# Patient Record
Sex: Female | Born: 1990 | Hispanic: Refuse to answer | Marital: Single | Smoking: Former smoker
Health system: Southern US, Community
[De-identification: ages and names within clinical notes are randomized; demographics above are authoritative.]

## PROBLEM LIST (undated history)

## (undated) DIAGNOSIS — Z789 Other specified health status: Secondary | ICD-10-CM

## (undated) DIAGNOSIS — F909 Attention-deficit hyperactivity disorder, unspecified type: Secondary | ICD-10-CM

## (undated) DIAGNOSIS — F419 Anxiety disorder, unspecified: Secondary | ICD-10-CM

## (undated) DIAGNOSIS — S129XXA Fracture of neck, unspecified, initial encounter: Secondary | ICD-10-CM

## (undated) DIAGNOSIS — B009 Herpesviral infection, unspecified: Secondary | ICD-10-CM

## (undated) DIAGNOSIS — A749 Chlamydial infection, unspecified: Secondary | ICD-10-CM

## (undated) DIAGNOSIS — F32A Depression, unspecified: Secondary | ICD-10-CM

## (undated) HISTORY — DX: Attention-deficit hyperactivity disorder, unspecified type: F90.9

## (undated) HISTORY — DX: Depression, unspecified: F32.A

## (undated) HISTORY — DX: Anxiety disorder, unspecified: F41.9

## (undated) HISTORY — PX: OTHER SURGICAL HISTORY: SHX169

## (undated) HISTORY — PX: ADENOIDECTOMY: SUR15

## (undated) HISTORY — PX: TONSILLECTOMY AND ADENOIDECTOMY: SUR1326

---
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
## 2015-02-26 LAB — OB RESULTS CONSOLE RUBELLA ANTIBODY, IGM: Rubella: NON-IMMUNE/NOT IMMUNE

## 2015-02-26 LAB — OB RESULTS CONSOLE GC/CHLAMYDIA
CHLAMYDIA, DNA PROBE: NEGATIVE
GC PROBE AMP, GENITAL: NEGATIVE

## 2015-02-26 LAB — OB RESULTS CONSOLE HEPATITIS B SURFACE ANTIGEN: Hepatitis B Surface Ag: NEGATIVE

## 2015-02-26 LAB — OB RESULTS CONSOLE ANTIBODY SCREEN: ANTIBODY SCREEN: NEGATIVE

## 2015-02-26 LAB — OB RESULTS CONSOLE ABO/RH: RH Type: POSITIVE

## 2015-06-03 ENCOUNTER — Other Ambulatory Visit (HOSPITAL_COMMUNITY): Payer: Self-pay | Admitting: Obstetrics and Gynecology

## 2015-06-03 DIAGNOSIS — O30002 Twin pregnancy, unspecified number of placenta and unspecified number of amniotic sacs, second trimester: Secondary | ICD-10-CM

## 2015-06-03 DIAGNOSIS — Z3689 Encounter for other specified antenatal screening: Secondary | ICD-10-CM

## 2015-06-03 DIAGNOSIS — Z3A22 22 weeks gestation of pregnancy: Secondary | ICD-10-CM

## 2015-06-10 ENCOUNTER — Encounter (HOSPITAL_COMMUNITY): Payer: Self-pay

## 2015-06-10 ENCOUNTER — Other Ambulatory Visit (HOSPITAL_COMMUNITY): Payer: Self-pay | Admitting: Obstetrics and Gynecology

## 2015-06-10 ENCOUNTER — Ambulatory Visit (HOSPITAL_COMMUNITY)
Admission: RE | Admit: 2015-06-10 | Discharge: 2015-06-10 | Disposition: A | Discharge status: Home / Self Care | Payer: BLUE CROSS/BLUE SHIELD | Source: Ambulatory Visit | Attending: Obstetrics and Gynecology | Admitting: Obstetrics and Gynecology

## 2015-06-10 DIAGNOSIS — O99332 Smoking (tobacco) complicating pregnancy, second trimester: Secondary | ICD-10-CM

## 2015-06-10 DIAGNOSIS — O30042 Twin pregnancy, dichorionic/diamniotic, second trimester: Secondary | ICD-10-CM | POA: Insufficient documentation

## 2015-06-10 DIAGNOSIS — Z3A22 22 weeks gestation of pregnancy: Secondary | ICD-10-CM | POA: Diagnosis not present

## 2015-06-10 DIAGNOSIS — O365922 Maternal care for other known or suspected poor fetal growth, second trimester, fetus 2: Secondary | ICD-10-CM | POA: Diagnosis not present

## 2015-06-10 DIAGNOSIS — Z3689 Encounter for other specified antenatal screening: Secondary | ICD-10-CM

## 2015-06-10 DIAGNOSIS — O30002 Twin pregnancy, unspecified number of placenta and unspecified number of amniotic sacs, second trimester: Secondary | ICD-10-CM

## 2015-06-10 DIAGNOSIS — Z36 Encounter for antenatal screening of mother: Secondary | ICD-10-CM | POA: Diagnosis not present

## 2015-06-19 ENCOUNTER — Other Ambulatory Visit (HOSPITAL_COMMUNITY): Payer: Self-pay | Admitting: Obstetrics and Gynecology

## 2015-06-19 ENCOUNTER — Ambulatory Visit (HOSPITAL_COMMUNITY)
Admission: RE | Admit: 2015-06-19 | Discharge: 2015-06-19 | Disposition: A | Discharge status: Home / Self Care | Payer: BLUE CROSS/BLUE SHIELD | Source: Ambulatory Visit | Attending: Obstetrics and Gynecology | Admitting: Obstetrics and Gynecology

## 2015-06-19 ENCOUNTER — Encounter (HOSPITAL_COMMUNITY): Payer: Self-pay

## 2015-06-19 DIAGNOSIS — O99332 Smoking (tobacco) complicating pregnancy, second trimester: Secondary | ICD-10-CM

## 2015-06-19 DIAGNOSIS — O30042 Twin pregnancy, dichorionic/diamniotic, second trimester: Secondary | ICD-10-CM

## 2015-06-19 DIAGNOSIS — Z3A23 23 weeks gestation of pregnancy: Secondary | ICD-10-CM

## 2015-06-19 DIAGNOSIS — O365922 Maternal care for other known or suspected poor fetal growth, second trimester, fetus 2: Secondary | ICD-10-CM | POA: Diagnosis not present

## 2015-06-19 HISTORY — DX: Other specified health status: Z78.9

## 2015-06-25 ENCOUNTER — Encounter (HOSPITAL_COMMUNITY): Payer: Self-pay

## 2015-06-25 ENCOUNTER — Ambulatory Visit (HOSPITAL_COMMUNITY)
Admission: RE | Admit: 2015-06-25 | Discharge: 2015-06-25 | Disposition: A | Discharge status: Home / Self Care | Payer: BLUE CROSS/BLUE SHIELD | Source: Ambulatory Visit | Attending: Obstetrics and Gynecology | Admitting: Obstetrics and Gynecology

## 2015-06-25 DIAGNOSIS — O365922 Maternal care for other known or suspected poor fetal growth, second trimester, fetus 2: Secondary | ICD-10-CM | POA: Diagnosis not present

## 2015-06-25 DIAGNOSIS — O30042 Twin pregnancy, dichorionic/diamniotic, second trimester: Secondary | ICD-10-CM | POA: Diagnosis present

## 2015-07-02 ENCOUNTER — Ambulatory Visit (HOSPITAL_COMMUNITY)
Admission: RE | Admit: 2015-07-02 | Discharge: 2015-07-02 | Disposition: A | Discharge status: Home / Self Care | Payer: BLUE CROSS/BLUE SHIELD | Source: Ambulatory Visit | Attending: Obstetrics and Gynecology | Admitting: Obstetrics and Gynecology

## 2015-07-02 ENCOUNTER — Encounter (HOSPITAL_COMMUNITY): Payer: Self-pay

## 2015-07-02 DIAGNOSIS — O30042 Twin pregnancy, dichorionic/diamniotic, second trimester: Secondary | ICD-10-CM | POA: Diagnosis not present

## 2015-07-02 DIAGNOSIS — O365922 Maternal care for other known or suspected poor fetal growth, second trimester, fetus 2: Secondary | ICD-10-CM | POA: Diagnosis not present

## 2015-07-08 ENCOUNTER — Other Ambulatory Visit (HOSPITAL_COMMUNITY): Payer: Self-pay | Admitting: Obstetrics and Gynecology

## 2015-07-08 DIAGNOSIS — O99333 Smoking (tobacco) complicating pregnancy, third trimester: Secondary | ICD-10-CM

## 2015-07-08 DIAGNOSIS — O365922 Maternal care for other known or suspected poor fetal growth, second trimester, fetus 2: Secondary | ICD-10-CM

## 2015-07-08 DIAGNOSIS — O30042 Twin pregnancy, dichorionic/diamniotic, second trimester: Secondary | ICD-10-CM

## 2015-07-08 DIAGNOSIS — Z3A26 26 weeks gestation of pregnancy: Secondary | ICD-10-CM

## 2015-07-09 ENCOUNTER — Ambulatory Visit (HOSPITAL_COMMUNITY)
Admission: RE | Admit: 2015-07-09 | Discharge: 2015-07-09 | Disposition: A | Discharge status: Home / Self Care | Payer: BLUE CROSS/BLUE SHIELD | Source: Ambulatory Visit | Attending: Obstetrics and Gynecology | Admitting: Obstetrics and Gynecology

## 2015-07-09 ENCOUNTER — Encounter (HOSPITAL_COMMUNITY): Payer: Self-pay

## 2015-07-09 DIAGNOSIS — O99333 Smoking (tobacco) complicating pregnancy, third trimester: Secondary | ICD-10-CM

## 2015-07-09 DIAGNOSIS — Z3A26 26 weeks gestation of pregnancy: Secondary | ICD-10-CM | POA: Diagnosis not present

## 2015-07-09 DIAGNOSIS — O99332 Smoking (tobacco) complicating pregnancy, second trimester: Secondary | ICD-10-CM | POA: Diagnosis not present

## 2015-07-09 DIAGNOSIS — O365922 Maternal care for other known or suspected poor fetal growth, second trimester, fetus 2: Secondary | ICD-10-CM | POA: Insufficient documentation

## 2015-07-09 DIAGNOSIS — O30042 Twin pregnancy, dichorionic/diamniotic, second trimester: Secondary | ICD-10-CM | POA: Insufficient documentation

## 2015-07-09 LAB — OB RESULTS CONSOLE RPR: RPR: NONREACTIVE

## 2015-07-09 LAB — OB RESULTS CONSOLE HIV ANTIBODY (ROUTINE TESTING): HIV: NONREACTIVE

## 2015-08-13 ENCOUNTER — Inpatient Hospital Stay (HOSPITAL_COMMUNITY)
Admission: AD | Admit: 2015-08-13 | Discharge: 2015-08-13 | Disposition: A | Discharge status: Home / Self Care | Payer: BLUE CROSS/BLUE SHIELD | Source: Ambulatory Visit | Attending: Obstetrics and Gynecology | Admitting: Obstetrics and Gynecology

## 2015-08-13 DIAGNOSIS — Z3A31 31 weeks gestation of pregnancy: Secondary | ICD-10-CM | POA: Insufficient documentation

## 2015-08-13 MED ORDER — BETAMETHASONE SOD PHOS & ACET 6 (3-3) MG/ML IJ SUSP
12.0000 mg | Freq: Once | INTRAMUSCULAR | Status: AC
  Administered 2015-08-13: 12 mg via INTRAMUSCULAR
  Filled 2015-08-13: qty 2

## 2015-08-13 NOTE — MAU Note (Signed)
Sent from office for betamethasone injection

## 2015-08-14 ENCOUNTER — Inpatient Hospital Stay (HOSPITAL_COMMUNITY)
Admission: AD | Admit: 2015-08-14 | Discharge: 2015-08-14 | Disposition: A | Discharge status: Home / Self Care | Payer: BLUE CROSS/BLUE SHIELD | Source: Ambulatory Visit | Attending: Obstetrics & Gynecology | Admitting: Obstetrics & Gynecology

## 2015-08-14 DIAGNOSIS — Z3A31 31 weeks gestation of pregnancy: Secondary | ICD-10-CM | POA: Diagnosis not present

## 2015-08-14 MED ORDER — BETAMETHASONE SOD PHOS & ACET 6 (3-3) MG/ML IJ SUSP
12.0000 mg | Freq: Once | INTRAMUSCULAR | Status: AC
  Administered 2015-08-14: 12 mg via INTRAMUSCULAR
  Filled 2015-08-14: qty 2

## 2015-08-18 ENCOUNTER — Other Ambulatory Visit (HOSPITAL_COMMUNITY): Payer: Self-pay | Admitting: Obstetrics & Gynecology

## 2015-08-18 ENCOUNTER — Ambulatory Visit (HOSPITAL_COMMUNITY)
Admission: RE | Admit: 2015-08-18 | Discharge: 2015-08-18 | Disposition: A | Discharge status: Home / Self Care | Payer: BLUE CROSS/BLUE SHIELD | Source: Ambulatory Visit | Attending: Obstetrics & Gynecology | Admitting: Obstetrics & Gynecology

## 2015-08-18 DIAGNOSIS — O365922 Maternal care for other known or suspected poor fetal growth, second trimester, fetus 2: Secondary | ICD-10-CM | POA: Insufficient documentation

## 2015-08-18 DIAGNOSIS — O30042 Twin pregnancy, dichorionic/diamniotic, second trimester: Secondary | ICD-10-CM | POA: Insufficient documentation

## 2015-08-18 DIAGNOSIS — IMO0002 Reserved for concepts with insufficient information to code with codable children: Secondary | ICD-10-CM

## 2015-08-18 DIAGNOSIS — IMO0001 Reserved for inherently not codable concepts without codable children: Secondary | ICD-10-CM

## 2015-08-18 DIAGNOSIS — O99332 Smoking (tobacco) complicating pregnancy, second trimester: Secondary | ICD-10-CM | POA: Insufficient documentation

## 2015-08-18 DIAGNOSIS — Z3A32 32 weeks gestation of pregnancy: Secondary | ICD-10-CM

## 2015-08-25 ENCOUNTER — Ambulatory Visit (HOSPITAL_COMMUNITY)
Admission: RE | Admit: 2015-08-25 | Discharge: 2015-08-25 | Disposition: A | Discharge status: Home / Self Care | Payer: BLUE CROSS/BLUE SHIELD | Source: Ambulatory Visit | Attending: Obstetrics & Gynecology | Admitting: Obstetrics & Gynecology

## 2015-08-25 DIAGNOSIS — IMO0001 Reserved for inherently not codable concepts without codable children: Secondary | ICD-10-CM

## 2015-08-25 DIAGNOSIS — O365931 Maternal care for other known or suspected poor fetal growth, third trimester, fetus 1: Secondary | ICD-10-CM | POA: Diagnosis not present

## 2015-08-25 DIAGNOSIS — O365932 Maternal care for other known or suspected poor fetal growth, third trimester, fetus 2: Secondary | ICD-10-CM | POA: Diagnosis not present

## 2015-08-25 DIAGNOSIS — O99332 Smoking (tobacco) complicating pregnancy, second trimester: Secondary | ICD-10-CM | POA: Diagnosis not present

## 2015-08-25 DIAGNOSIS — O30042 Twin pregnancy, dichorionic/diamniotic, second trimester: Secondary | ICD-10-CM | POA: Diagnosis not present

## 2015-08-25 DIAGNOSIS — Z3A33 33 weeks gestation of pregnancy: Secondary | ICD-10-CM | POA: Insufficient documentation

## 2015-09-01 ENCOUNTER — Other Ambulatory Visit: Payer: Self-pay | Admitting: Obstetrics & Gynecology

## 2015-09-01 ENCOUNTER — Encounter (HOSPITAL_COMMUNITY): Payer: Self-pay | Admitting: Anesthesiology

## 2015-09-01 ENCOUNTER — Ambulatory Visit (HOSPITAL_COMMUNITY)
Admission: RE | Admit: 2015-09-01 | Discharge: 2015-09-01 | Disposition: A | Discharge status: Home / Self Care | Payer: BLUE CROSS/BLUE SHIELD | Source: Ambulatory Visit | Attending: Maternal and Fetal Medicine | Admitting: Maternal and Fetal Medicine

## 2015-09-01 ENCOUNTER — Encounter (HOSPITAL_COMMUNITY): Payer: Self-pay

## 2015-09-01 DIAGNOSIS — O30042 Twin pregnancy, dichorionic/diamniotic, second trimester: Secondary | ICD-10-CM | POA: Diagnosis present

## 2015-09-01 DIAGNOSIS — O365932 Maternal care for other known or suspected poor fetal growth, third trimester, fetus 2: Secondary | ICD-10-CM | POA: Diagnosis not present

## 2015-09-01 DIAGNOSIS — O365931 Maternal care for other known or suspected poor fetal growth, third trimester, fetus 1: Secondary | ICD-10-CM | POA: Diagnosis not present

## 2015-09-01 DIAGNOSIS — O99332 Smoking (tobacco) complicating pregnancy, second trimester: Secondary | ICD-10-CM | POA: Diagnosis not present

## 2015-09-01 DIAGNOSIS — Z3A34 34 weeks gestation of pregnancy: Secondary | ICD-10-CM | POA: Insufficient documentation

## 2015-09-01 DIAGNOSIS — IMO0001 Reserved for inherently not codable concepts without codable children: Secondary | ICD-10-CM

## 2015-09-08 ENCOUNTER — Ambulatory Visit (HOSPITAL_COMMUNITY)
Admission: RE | Admit: 2015-09-08 | Discharge: 2015-09-08 | Disposition: A | Discharge status: Home / Self Care | Payer: BLUE CROSS/BLUE SHIELD | Source: Ambulatory Visit | Attending: Obstetrics & Gynecology | Admitting: Obstetrics & Gynecology

## 2015-09-08 ENCOUNTER — Encounter (HOSPITAL_COMMUNITY): Payer: Self-pay

## 2015-09-08 DIAGNOSIS — Z3A35 35 weeks gestation of pregnancy: Secondary | ICD-10-CM

## 2015-09-08 DIAGNOSIS — O365931 Maternal care for other known or suspected poor fetal growth, third trimester, fetus 1: Secondary | ICD-10-CM

## 2015-09-08 DIAGNOSIS — O99333 Smoking (tobacco) complicating pregnancy, third trimester: Secondary | ICD-10-CM

## 2015-09-08 DIAGNOSIS — O365932 Maternal care for other known or suspected poor fetal growth, third trimester, fetus 2: Secondary | ICD-10-CM

## 2015-09-08 DIAGNOSIS — O30043 Twin pregnancy, dichorionic/diamniotic, third trimester: Secondary | ICD-10-CM

## 2015-09-08 DIAGNOSIS — IMO0001 Reserved for inherently not codable concepts without codable children: Secondary | ICD-10-CM

## 2015-09-10 ENCOUNTER — Encounter (HOSPITAL_COMMUNITY): Payer: Self-pay

## 2015-09-10 NOTE — Patient Instructions (Signed)
Your procedure is scheduled on:  Monday, Dec. 5, 2016  [facility] at:  1:45 P.M.  Pick up the phone at the desk and dial [phone].  Call this number if you have problems the morning of surgery: [phone].  Remember: Do NOT eat food:  After Midnight Sunday Do NOT drink clear liquids after:  11:00 A.M. Day of surgery Take these medicines the morning of surgery with a SIP OF WATER:  None  Do NOT wear jewelry (body piercing), metal hair clips/bobby pins, or nail polish. Do NOT wear lotions, powders, or perfumes.  You may wear deoderant. Do NOT shave for 48 hours prior to surgery. Do NOT bring valuables to the hospital. Leave suitcase in car.  After surgery it may be brought to your room.  For patients admitted to the hospital, checkout time is 11:00 AM the day of discharge.

## 2015-09-10 NOTE — Anesthesia Preprocedure Evaluation (Deleted)
Anesthesia Evaluation  Patient identified by MRN, date of birth, ID band Patient awake    Reviewed: Allergy & Precautions, NPO status , Patient's Chart, lab work & pertinent test results  Airway        Dental   Pulmonary Current Smoker,           Cardiovascular   BP slightly elevated   Neuro/Psych negative neurological ROS     GI/Hepatic negative GI ROS, Neg liver ROS,   Endo/Other    Renal/GU negative Renal ROS     Musculoskeletal   Abdominal   Peds  Hematology   Anesthesia Other Findings   Reproductive/Obstetrics (+) Pregnancy TWINS                             Anesthesia Physical Anesthesia Plan  ASA: II  Anesthesia Plan: Spinal   Post-op Pain Management:    Induction:   Airway Management Planned:   Additional Equipment:   Intra-op Plan:   Post-operative Plan:   Informed Consent: I have reviewed the patients History and Physical, chart, labs and discussed the procedure including the risks, benefits and alternatives for the proposed anesthesia with the patient or authorized representative who has indicated his/her understanding and acceptance.     Plan Discussed with:   Anesthesia Plan Comments: (Check am labs)        Anesthesia Quick Evaluation

## 2015-09-11 ENCOUNTER — Encounter (HOSPITAL_COMMUNITY)
Admission: RE | Admit: 2015-09-11 | Discharge: 2015-09-11 | Disposition: A | Discharge status: Home / Self Care | Payer: BLUE CROSS/BLUE SHIELD | Source: Ambulatory Visit | Attending: Obstetrics & Gynecology | Admitting: Obstetrics & Gynecology

## 2015-09-11 ENCOUNTER — Encounter (HOSPITAL_COMMUNITY): Payer: Self-pay | Admitting: *Deleted

## 2015-09-11 ENCOUNTER — Encounter (HOSPITAL_COMMUNITY)
Admission: AD | Disposition: A | Discharge status: Home / Self Care | Payer: Self-pay | Source: Ambulatory Visit | Attending: Obstetrics and Gynecology

## 2015-09-11 ENCOUNTER — Encounter (HOSPITAL_COMMUNITY): Payer: Self-pay

## 2015-09-11 ENCOUNTER — Inpatient Hospital Stay (HOSPITAL_COMMUNITY): Payer: BLUE CROSS/BLUE SHIELD | Admitting: Anesthesiology

## 2015-09-11 ENCOUNTER — Inpatient Hospital Stay (HOSPITAL_COMMUNITY)
Admission: AD | Admit: 2015-09-11 | Discharge: 2015-09-14 | DRG: 765 | Disposition: A | Discharge status: Home / Self Care | Payer: BLUE CROSS/BLUE SHIELD | Source: Ambulatory Visit | Attending: Obstetrics and Gynecology | Admitting: Obstetrics and Gynecology

## 2015-09-11 DIAGNOSIS — O42913 Preterm premature rupture of membranes, unspecified as to length of time between rupture and onset of labor, third trimester: Secondary | ICD-10-CM | POA: Diagnosis present

## 2015-09-11 DIAGNOSIS — N838 Other noninflammatory disorders of ovary, fallopian tube and broad ligament: Secondary | ICD-10-CM | POA: Diagnosis present

## 2015-09-11 DIAGNOSIS — Z283 Underimmunization status: Secondary | ICD-10-CM

## 2015-09-11 DIAGNOSIS — Z98891 History of uterine scar from previous surgery: Secondary | ICD-10-CM

## 2015-09-11 DIAGNOSIS — O321XX0 Maternal care for breech presentation, not applicable or unspecified: Principal | ICD-10-CM | POA: Diagnosis present

## 2015-09-11 DIAGNOSIS — O98319 Other infections with a predominantly sexual mode of transmission complicating pregnancy, unspecified trimester: Secondary | ICD-10-CM | POA: Diagnosis present

## 2015-09-11 DIAGNOSIS — Z3A35 35 weeks gestation of pregnancy: Secondary | ICD-10-CM

## 2015-09-11 DIAGNOSIS — S129XXA Fracture of neck, unspecified, initial encounter: Secondary | ICD-10-CM | POA: Diagnosis not present

## 2015-09-11 DIAGNOSIS — B009 Herpesviral infection, unspecified: Secondary | ICD-10-CM | POA: Diagnosis not present

## 2015-09-11 DIAGNOSIS — O99334 Smoking (tobacco) complicating childbirth: Secondary | ICD-10-CM | POA: Diagnosis present

## 2015-09-11 DIAGNOSIS — O30009 Twin pregnancy, unspecified number of placenta and unspecified number of amniotic sacs, unspecified trimester: Secondary | ICD-10-CM | POA: Diagnosis present

## 2015-09-11 DIAGNOSIS — Z885 Allergy status to narcotic agent status: Secondary | ICD-10-CM

## 2015-09-11 DIAGNOSIS — IMO0001 Reserved for inherently not codable concepts without codable children: Secondary | ICD-10-CM

## 2015-09-11 DIAGNOSIS — O321XX1 Maternal care for breech presentation, fetus 1: Secondary | ICD-10-CM | POA: Diagnosis present

## 2015-09-11 DIAGNOSIS — O30003 Twin pregnancy, unspecified number of placenta and unspecified number of amniotic sacs, third trimester: Secondary | ICD-10-CM | POA: Diagnosis present

## 2015-09-11 DIAGNOSIS — Z2839 Other underimmunization status: Secondary | ICD-10-CM

## 2015-09-11 DIAGNOSIS — F17200 Nicotine dependence, unspecified, uncomplicated: Secondary | ICD-10-CM

## 2015-09-11 DIAGNOSIS — Z91018 Allergy to other foods: Secondary | ICD-10-CM | POA: Diagnosis not present

## 2015-09-11 DIAGNOSIS — O09899 Supervision of other high risk pregnancies, unspecified trimester: Secondary | ICD-10-CM

## 2015-09-11 DIAGNOSIS — O9989 Other specified diseases and conditions complicating pregnancy, childbirth and the puerperium: Secondary | ICD-10-CM

## 2015-09-11 DIAGNOSIS — A6000 Herpesviral infection of urogenital system, unspecified: Secondary | ICD-10-CM | POA: Diagnosis present

## 2015-09-11 DIAGNOSIS — O30043 Twin pregnancy, dichorionic/diamniotic, third trimester: Secondary | ICD-10-CM | POA: Diagnosis present

## 2015-09-11 DIAGNOSIS — O09299 Supervision of pregnancy with other poor reproductive or obstetric history, unspecified trimester: Secondary | ICD-10-CM

## 2015-09-11 DIAGNOSIS — O365930 Maternal care for other known or suspected poor fetal growth, third trimester, not applicable or unspecified: Secondary | ICD-10-CM | POA: Diagnosis present

## 2015-09-11 HISTORY — DX: Herpesviral infection, unspecified: B00.9

## 2015-09-11 HISTORY — DX: Fracture of neck, unspecified, initial encounter: S12.9XXA

## 2015-09-11 HISTORY — DX: Chlamydial infection, unspecified: A74.9

## 2015-09-11 LAB — CBC
HEMATOCRIT: 40.1 % (ref 36.0–46.0)
Hemoglobin: 13.8 g/dL (ref 12.0–15.0)
MCH: 28.7 pg (ref 26.0–34.0)
MCHC: 34.4 g/dL (ref 30.0–36.0)
MCV: 83.4 fL (ref 78.0–100.0)
PLATELETS: 279 10*3/uL (ref 150–400)
RBC: 4.81 MIL/uL (ref 3.87–5.11)
RDW: 13.8 % (ref 11.5–15.5)
WBC: 11.9 10*3/uL — ABNORMAL HIGH (ref 4.0–10.5)

## 2015-09-11 LAB — COMPREHENSIVE METABOLIC PANEL
ALK PHOS: 117 U/L (ref 38–126)
ALT: 14 U/L (ref 14–54)
AST: 17 U/L (ref 15–41)
Albumin: 2.9 g/dL — ABNORMAL LOW (ref 3.5–5.0)
Anion gap: 10 (ref 5–15)
BILIRUBIN TOTAL: 0.2 mg/dL — AB (ref 0.3–1.2)
BUN: 6 mg/dL (ref 6–20)
CALCIUM: 8.8 mg/dL — AB (ref 8.9–10.3)
CHLORIDE: 100 mmol/L — AB (ref 101–111)
CO2: 19 mmol/L — ABNORMAL LOW (ref 22–32)
CREATININE: 0.44 mg/dL (ref 0.44–1.00)
GFR calc Af Amer: >60 mL/min (ref >60)
Glucose, Bld: 84 mg/dL (ref 65–99)
Potassium: 3.7 mmol/L (ref 3.5–5.1)
Sodium: 129 mmol/L — ABNORMAL LOW (ref 135–145)
TOTAL PROTEIN: 6 g/dL — AB (ref 6.5–8.1)

## 2015-09-11 LAB — GROUP B STREP BY PCR: GROUP B STREP BY PCR: NEGATIVE

## 2015-09-11 LAB — TYPE AND SCREEN
ABO/RH(D): O POS
Antibody Screen: NEGATIVE

## 2015-09-11 LAB — PROTEIN / CREATININE RATIO, URINE
Creatinine, Urine: 165 mg/dL
Protein Creatinine Ratio: 0.11 mg/mg{Cre} (ref 0.00–0.15)
Total Protein, Urine: 18 mg/dL

## 2015-09-11 LAB — ABO/RH: ABO/RH(D): O POS

## 2015-09-11 LAB — LACTATE DEHYDROGENASE: LDH: 130 U/L (ref 98–192)

## 2015-09-11 LAB — URIC ACID: URIC ACID, SERUM: 5.9 mg/dL (ref 2.3–6.6)

## 2015-09-11 SURGERY — Surgical Case
Anesthesia: Spinal

## 2015-09-11 MED ORDER — NALOXONE HCL 2 MG/2ML IJ SOSY
1.0000 ug/kg/h | PREFILLED_SYRINGE | INTRAVENOUS | Status: DC | PRN
  Filled 2015-09-11: qty 2

## 2015-09-11 MED ORDER — OXYTOCIN 10 UNIT/ML IJ SOLN
40.0000 [IU] | INTRAVENOUS | Status: DC | PRN
  Administered 2015-09-11: 40 [IU] via INTRAVENOUS

## 2015-09-11 MED ORDER — KETOROLAC TROMETHAMINE 30 MG/ML IJ SOLN
INTRAMUSCULAR | Status: AC
  Filled 2015-09-11: qty 1

## 2015-09-11 MED ORDER — PHENYLEPHRINE 40 MCG/ML (10ML) SYRINGE FOR IV PUSH (FOR BLOOD PRESSURE SUPPORT)
PREFILLED_SYRINGE | INTRAVENOUS | Status: AC
  Filled 2015-09-11: qty 10

## 2015-09-11 MED ORDER — DIPHENHYDRAMINE HCL 25 MG PO CAPS: 25.0000 mg | ORAL_CAPSULE | ORAL | Status: DC | PRN

## 2015-09-11 MED ORDER — BETAMETHASONE SOD PHOS & ACET 6 (3-3) MG/ML IJ SUSP
12.0000 mg | INTRAMUSCULAR | Status: DC
  Filled 2015-09-11: qty 2

## 2015-09-11 MED ORDER — BUPIVACAINE IN DEXTROSE 0.75-8.25 % IT SOLN
INTRATHECAL | Status: DC | PRN
  Administered 2015-09-11: 1.4 mL via INTRATHECAL

## 2015-09-11 MED ORDER — HYDRALAZINE HCL 20 MG/ML IJ SOLN: 10.0000 mg | Freq: Once | INTRAMUSCULAR | Status: DC | PRN

## 2015-09-11 MED ORDER — DIPHENHYDRAMINE HCL 50 MG/ML IJ SOLN: 12.5000 mg | INTRAMUSCULAR | Status: DC | PRN

## 2015-09-11 MED ORDER — SODIUM CHLORIDE 0.9 % IJ SOLN: 3.0000 mL | INTRAMUSCULAR | Status: DC | PRN

## 2015-09-11 MED ORDER — FENTANYL CITRATE (PF) 100 MCG/2ML IJ SOLN
INTRAMUSCULAR | Status: DC | PRN
  Administered 2015-09-11: 25 ug via INTRATHECAL

## 2015-09-11 MED ORDER — CEFAZOLIN SODIUM-DEXTROSE 2-3 GM-% IV SOLR: 2.0000 g | Freq: Once | INTRAVENOUS | Status: DC

## 2015-09-11 MED ORDER — NALOXONE HCL 0.4 MG/ML IJ SOLN: 0.4000 mg | INTRAMUSCULAR | Status: DC | PRN

## 2015-09-11 MED ORDER — LABETALOL HCL 5 MG/ML IV SOLN
20.0000 mg | INTRAVENOUS | Status: DC | PRN
Start: 2015-09-11 — End: 2015-09-14

## 2015-09-11 MED ORDER — KETOROLAC TROMETHAMINE 30 MG/ML IJ SOLN: 30.0000 mg | Freq: Four times a day (QID) | INTRAMUSCULAR | Status: AC | PRN

## 2015-09-11 MED ORDER — IBUPROFEN 600 MG PO TABS
600.0000 mg | ORAL_TABLET | Freq: Four times a day (QID) | ORAL | Status: DC | PRN
  Administered 2015-09-12 – 2015-09-14 (×10): 600 mg via ORAL
  Filled 2015-09-11 (×11): qty 1

## 2015-09-11 MED ORDER — MEPERIDINE HCL 25 MG/ML IJ SOLN: 6.2500 mg | INTRAMUSCULAR | Status: DC | PRN

## 2015-09-11 MED ORDER — FENTANYL CITRATE (PF) 100 MCG/2ML IJ SOLN: 25.0000 ug | INTRAMUSCULAR | Status: DC | PRN

## 2015-09-11 MED ORDER — NALBUPHINE HCL 10 MG/ML IJ SOLN: 5.0000 mg | Freq: Once | INTRAMUSCULAR | Status: DC | PRN

## 2015-09-11 MED ORDER — ONDANSETRON HCL 4 MG/2ML IJ SOLN: 4.0000 mg | Freq: Three times a day (TID) | INTRAMUSCULAR | Status: DC | PRN

## 2015-09-11 MED ORDER — CITRIC ACID-SODIUM CITRATE 334-500 MG/5ML PO SOLN
30.0000 mL | Freq: Once | ORAL | Status: AC
  Administered 2015-09-11: 30 mL via ORAL
  Filled 2015-09-11: qty 15

## 2015-09-11 MED ORDER — NALBUPHINE HCL 10 MG/ML IJ SOLN: 5.0000 mg | INTRAMUSCULAR | Status: DC | PRN

## 2015-09-11 MED ORDER — KETOROLAC TROMETHAMINE 30 MG/ML IJ SOLN
30.0000 mg | Freq: Four times a day (QID) | INTRAMUSCULAR | Status: AC | PRN
  Administered 2015-09-11: 30 mg via INTRAMUSCULAR

## 2015-09-11 MED ORDER — MORPHINE SULFATE (PF) 0.5 MG/ML IJ SOLN
INTRAMUSCULAR | Status: DC | PRN
  Administered 2015-09-11: .15 mg via EPIDURAL

## 2015-09-11 MED ORDER — CEFAZOLIN SODIUM-DEXTROSE 2-3 GM-% IV SOLR
2.0000 g | Freq: Once | INTRAVENOUS | Status: AC
  Administered 2015-09-11: 2 g via INTRAVENOUS
  Filled 2015-09-11: qty 50

## 2015-09-11 MED ORDER — FAMOTIDINE IN NACL 20-0.9 MG/50ML-% IV SOLN
20.0000 mg | Freq: Once | INTRAVENOUS | Status: AC
  Administered 2015-09-11: 20 mg via INTRAVENOUS
  Filled 2015-09-11: qty 50

## 2015-09-11 MED ORDER — LACTATED RINGERS IV SOLN
INTRAVENOUS | Status: DC
  Administered 2015-09-11 – 2015-09-12 (×5): via INTRAVENOUS

## 2015-09-11 MED ORDER — SCOPOLAMINE 1 MG/3DAYS TD PT72
1.0000 | MEDICATED_PATCH | Freq: Once | TRANSDERMAL | Status: DC
  Filled 2015-09-11: qty 1

## 2015-09-11 MED ORDER — ONDANSETRON HCL 4 MG/2ML IJ SOLN
INTRAMUSCULAR | Status: DC | PRN
  Administered 2015-09-11: 4 mg via INTRAVENOUS

## 2015-09-11 MED ORDER — ONDANSETRON HCL 4 MG/2ML IJ SOLN
INTRAMUSCULAR | Status: AC
  Filled 2015-09-11: qty 2

## 2015-09-11 MED ORDER — PHENYLEPHRINE 8 MG IN D5W 100 ML (0.08MG/ML) PREMIX OPTIME
INJECTION | INTRAVENOUS | Status: DC | PRN
  Administered 2015-09-11: 40 ug/min via INTRAVENOUS
  Administered 2015-09-11: 80 ug/min via INTRAVENOUS

## 2015-09-11 MED ORDER — SCOPOLAMINE 1 MG/3DAYS TD PT72
MEDICATED_PATCH | TRANSDERMAL | Status: AC
  Filled 2015-09-11: qty 1

## 2015-09-11 MED ORDER — MORPHINE SULFATE (PF) 0.5 MG/ML IJ SOLN
INTRAMUSCULAR | Status: AC
Start: 2015-09-11 — End: 2015-09-11
  Filled 2015-09-11: qty 10

## 2015-09-11 MED ORDER — SCOPOLAMINE 1 MG/3DAYS TD PT72
MEDICATED_PATCH | TRANSDERMAL | Status: DC | PRN
  Administered 2015-09-11: 1 via TRANSDERMAL

## 2015-09-11 MED ORDER — MEPERIDINE HCL 25 MG/ML IJ SOLN
6.2500 mg | INTRAMUSCULAR | Status: DC | PRN
  Administered 2015-09-11: 12.5 mg via INTRAVENOUS
  Filled 2015-09-11 (×2): qty 1

## 2015-09-11 MED ORDER — PHENYLEPHRINE HCL 10 MG/ML IJ SOLN
INTRAMUSCULAR | Status: DC | PRN
  Administered 2015-09-11: 40 ug via INTRAVENOUS
  Administered 2015-09-11: 80 ug via INTRAVENOUS
  Administered 2015-09-11: 40 ug via INTRAVENOUS

## 2015-09-11 MED ORDER — OXYTOCIN 10 UNIT/ML IJ SOLN
INTRAMUSCULAR | Status: AC
  Filled 2015-09-11: qty 4

## 2015-09-11 MED ORDER — FENTANYL CITRATE (PF) 100 MCG/2ML IJ SOLN
INTRAMUSCULAR | Status: AC
  Filled 2015-09-11: qty 2

## 2015-09-11 SURGICAL SUPPLY — 37 items
APL SKNCLS STERI-STRIP NONHPOA (GAUZE/BANDAGES/DRESSINGS) ×2
BENZOIN TINCTURE PRP APPL 2/3 (GAUZE/BANDAGES/DRESSINGS) ×5 IMPLANT
CLAMP CORD UMBIL (MISCELLANEOUS) IMPLANT
CLOSURE WOUND 1/2 X4 (GAUZE/BANDAGES/DRESSINGS) ×1
CLOTH BEACON ORANGE TIMEOUT ST (SAFETY) ×3 IMPLANT
CONTAINER PREFILL 10% NBF 15ML (MISCELLANEOUS) IMPLANT
DRAPE SHEET LG 3/4 BI-LAMINATE (DRAPES) IMPLANT
DRSG OPSITE POSTOP 4X10 (GAUZE/BANDAGES/DRESSINGS) ×3 IMPLANT
DURAPREP 26ML APPLICATOR (WOUND CARE) ×3 IMPLANT
ELECT REM PT RETURN 9FT ADLT (ELECTROSURGICAL) ×3
ELECTRODE REM PT RTRN 9FT ADLT (ELECTROSURGICAL) ×1 IMPLANT
EXTRACTOR VACUUM M CUP 4 TUBE (SUCTIONS) IMPLANT
EXTRACTOR VACUUM M CUP 4' TUBE (SUCTIONS)
GLOVE BIO SURGEON STRL SZ7.5 (GLOVE) ×3 IMPLANT
GLOVE BIOGEL PI IND STRL 7.0 (GLOVE) ×1 IMPLANT
GLOVE BIOGEL PI IND STRL 7.5 (GLOVE) ×1 IMPLANT
GLOVE BIOGEL PI INDICATOR 7.0 (GLOVE) ×2
GLOVE BIOGEL PI INDICATOR 7.5 (GLOVE) ×2
GOWN STRL REUS W/TWL LRG LVL3 (GOWN DISPOSABLE) ×6 IMPLANT
KIT ABG SYR 3ML LUER SLIP (SYRINGE) IMPLANT
NDL HYPO 25X5/8 SAFETYGLIDE (NEEDLE) IMPLANT
NEEDLE HYPO 25X5/8 SAFETYGLIDE (NEEDLE) IMPLANT
NS IRRIG 1000ML POUR BTL (IV SOLUTION) ×3 IMPLANT
PACK C SECTION WH (CUSTOM PROCEDURE TRAY) ×3 IMPLANT
PAD OB MATERNITY 4.3X12.25 (PERSONAL CARE ITEMS) ×3 IMPLANT
PENCIL SMOKE EVAC W/HOLSTER (ELECTROSURGICAL) ×3 IMPLANT
RTRCTR C-SECT PINK 25CM LRG (MISCELLANEOUS) ×3 IMPLANT
STRIP CLOSURE SKIN 1/2X4 (GAUZE/BANDAGES/DRESSINGS) ×2 IMPLANT
SUT CHROMIC 2 0 CT 1 (SUTURE) ×3 IMPLANT
SUT MNCRL AB 3-0 PS2 27 (SUTURE) ×3 IMPLANT
SUT PLAIN 0 NONE (SUTURE) IMPLANT
SUT PLAIN 2 0 XLH (SUTURE) ×3 IMPLANT
SUT VIC AB 0 CT1 36 (SUTURE) ×3 IMPLANT
SUT VIC AB 0 CTX 36 (SUTURE) ×9
SUT VIC AB 0 CTX36XBRD ANBCTRL (SUTURE) ×3 IMPLANT
TOWEL OR 17X24 6PK STRL BLUE (TOWEL DISPOSABLE) ×3 IMPLANT
TRAY FOLEY CATH SILVER 14FR (SET/KITS/TRAYS/PACK) ×3 IMPLANT

## 2015-09-11 NOTE — Consult Note (Signed)
Neonatology Note:   Attendance at C-section:    I was asked by Dr. Su Hiltoberts to attend this primary C/S at 35 5/7 weeks due to twin gestation with malpresentation. C-section had been planned for 36 weeks due to IUGR. The mother is a G1P0 O pos, GBS pending with a history of HSV and smoking. There has been known IUGR of both twins. Probable SROM 9 hours prior to delivery, fluid clear. Mother afebrile.  Twin A, a boy, delivered breech and was vigorous with good spontaneous cry and tone. Needed no suctioning. Ap 9/9. Lungs clear to ausc in DR. Due to LBW, he was held by his parents, then transported to the NICU for further care.  Twin B, a girl, was delivered breech and was vigorous with good spontaneous cry and good tone. She did not require suctioning. Ap 8/9. Lungs clear to auscultation in DR. She is LBW, so was held by her parents, then transported to the NICU for further care.   The father attended both babies to the NICU.   Ashley Souhristie C. Clevon Khader, MD
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
## 2015-09-11 NOTE — MAU Note (Signed)
Calls to house coverage , BS coordinator, circulator, anes, NICU charge notified of pending  c/s

## 2015-09-11 NOTE — Anesthesia Preprocedure Evaluation (Signed)
Anesthesia Evaluation  Patient identified by MRN, date of birth, ID band Patient awake    Reviewed: Allergy & Precautions, NPO status , Patient's Chart, lab work & pertinent test results  Airway Mallampati: II  TM Distance: >3 FB Neck ROM: Full    Dental no notable dental hx. (+) Poor Dentition   Pulmonary Current Smoker,    Pulmonary exam normal breath sounds clear to auscultation       Cardiovascular negative cardio ROS Normal cardiovascular exam Rhythm:Regular Rate:Normal     Neuro/Psych negative neurological ROS  negative psych ROS   GI/Hepatic Neg liver ROS, GERD  Medicated and Controlled,  Endo/Other  negative endocrine ROS  Renal/GU negative Renal ROS  negative genitourinary   Musculoskeletal negative musculoskeletal ROS (+)   Abdominal   Peds  Hematology  (+) anemia ,   Anesthesia Other Findings   Reproductive/Obstetrics (+) Pregnancy Twin gestation Di/Di 34 weeks SROM HSV                             Anesthesia Physical Anesthesia Plan  ASA: II  Anesthesia Plan: Spinal   Post-op Pain Management:    Induction:   Airway Management Planned: Natural Airway  Additional Equipment:   Intra-op Plan:   Post-operative Plan:   Informed Consent: I have reviewed the patients History and Physical, chart, labs and discussed the procedure including the risks, benefits and alternatives for the proposed anesthesia with the patient or authorized representative who has indicated his/her understanding and acceptance.     Plan Discussed with: Anesthesiologist, CRNA and Surgeon  Anesthesia Plan Comments:         Anesthesia Quick Evaluation

## 2015-09-11 NOTE — Pre-Procedure Instructions (Signed)
Patient's water broke during pre op appointment transferred her to MAU

## 2015-09-11 NOTE — Addendum Note (Signed)
Addendum  created 09/11/15 2028 by Shelton SilvasKevin D Phoenicia Pirie, MD   Modules edited: Orders, PRL Based Order Sets

## 2015-09-11 NOTE — Transfer of Care (Signed)
Immediate Anesthesia Transfer of Care Note  Patient: Reginold Agentna M Haddock  Procedure(s) Performed: Procedure(s): CESAREAN SECTION (N/A)  Patient Location: PACU  Anesthesia Type:Spinal  Level of Consciousness: awake, alert  and oriented  Airway & Oxygen Therapy: Patient Spontanous Breathing  Post-op Assessment: Report given to RN  Post vital signs: Reviewed and stable  Last Vitals:  Filed Vitals:   09/11/15 1413 09/11/15 1428  BP:    Pulse: 91 80  Temp:    Resp:      Complications: No apparent anesthesia complications

## 2015-09-11 NOTE — Anesthesia Procedure Notes (Signed)
Spinal Patient location during procedure: OR Start time: 09/11/2015 4:20 PM Staffing Anesthesiologist: Mal AmabileFOSTER, Kemoni Ortega Performed by: anesthesiologist  Preanesthetic Checklist Completed: patient identified, site marked, surgical consent, pre-op evaluation, timeout performed, IV checked, risks and benefits discussed and monitors and equipment checked Spinal Block Patient position: sitting Prep: site prepped and draped and DuraPrep Patient monitoring: heart rate, cardiac monitor, continuous pulse ox and blood pressure Approach: midline Location: L3-4 Injection technique: single-shot Needle Needle type: Sprotte  Needle gauge: 24 G Needle length: 9 cm Needle insertion depth: 4.5 cm Assessment Sensory level: T4 Additional Notes Patient tolerated procedure well. Adequate sensory level.

## 2015-09-11 NOTE — Anesthesia Postprocedure Evaluation (Signed)
Anesthesia Post Note  Patient: Reginold Agentna M Haddock  Procedure(s) Performed: Procedure(s) (LRB): CESAREAN SECTION (N/A)  Patient location during evaluation: PACU Anesthesia Type: Spinal Level of consciousness: oriented and awake and alert Pain management: pain level controlled Vital Signs Assessment: post-procedure vital signs reviewed and stable Respiratory status: spontaneous breathing and respiratory function stable Cardiovascular status: blood pressure returned to baseline and stable Postop Assessment: no headache, no backache, patient able to bend at knees, spinal receding and no signs of nausea or vomiting Anesthetic complications: no    Last Vitals:  Filed Vitals:   09/11/15 1820 09/11/15 1821  BP:    Pulse: 73 73  Temp:    Resp: 15 17    Last Pain:  Filed Vitals:   09/11/15 1822  PainSc: 2     LLE Motor Response: Purposeful movement (09/11/15 1815) LLE Sensation: Full sensation (09/11/15 1815) RLE Motor Response: Purposeful movement (09/11/15 1815) RLE Sensation: Full sensation (09/11/15 1815)      Mercedees Convery A.

## 2015-09-11 NOTE — MAU Note (Signed)
Was at her pre-op appt, had sudden gush of clear fluid. Denies ctx's

## 2015-09-11 NOTE — Op Note (Signed)
Cesarean Section Procedure Note  Indications: P0 at 35 5/7 wks with twins malpresentation, PPROM, IUGR and eval for preeclampsia  Pre-operative Diagnosis: 1.Primary Cesarean Section 2.35 5/7 3. PPROM 4.IUGR 5.Eval for Preeclampsia   Post-operative Diagnosis: 1.Primary Cesarean Section 2.35 5/7 3. PPROM 4.IUGR 5.Eval for Preeclampsia  Procedure: PRIMARY CESAREAN SECTION  Surgeon: Osborn CohoAngela Nikita Surman, MD    Assistants: Nigel BridgemanVicki Latham, CNM  Anesthesia: Regional  Anesthesiologist: Mal AmabileMichael Foster, MD   Procedure Details  The patient was taken to the operating room secondary to primary c-section secondary to PPROM with IUGR after the risks, benefits, complications, treatment options, and expected outcomes were discussed with the patient.  The patient concurred with the proposed plan, giving informed consent which was signed and witnessed. The patient was taken to Operating Room C-Section Suite, identified as Ashley Perez and the procedure verified as C-Section Delivery. A Time Out was held and the above information confirmed.  After induction of anesthesia by obtaining a spinal, the patient was prepped and draped in the usual sterile manner. A Pfannenstiel skin incision was made and carried down through the subcutaneous tissue to the underlying layer of fascia.  The fascia was incised bilaterally and extended transversely bilaterally with the Mayo scissors. Kocher clamps were placed on the inferior aspect of the fascial incision and the underlying rectus muscle was separated from the fascia. The same was done on the superior aspect of the fascial incision.  The peritoneum was identified, entered bluntly and extended manually.  An Alexis self-retaining retractor was placed.  The utero-vesical peritoneal reflection was incised transversely and the bladder flap was bluntly freed from the lower uterine segment. A low transverse uterine incision was made with the scalpel and extended bilaterally with the bandage  scissors.  Twin A was delivered via breech extraction without difficulty.  Twin B was delivered via breech extraction without difficulty.  After the umbilical cord was clamped and cut, the infant was handed to the awaiting pediatricians.  Cord blood was obtained for evaluation.  The placenta was removed intact and appeared to be within normal limits. The uterus was cleared of all clots and debris. The uterine incision was closed with running interlocking sutures of 0 Vicryl and a second imbricating layer was performed as well.   Bilateral tubes and ovaries appeared to be within normal limits.  There was a right paratubal cyst which was excised and sent to pathology.  Good hemostasis was noted.  Copious irrigation was performed until clear.  The peritoneum was repaired with 2-0 chromic via a running suture.  The fascia was reapproximated with a running suture of 0 Vicryl. The subcutaneous tissue was reapproximated with 3 interrupted sutures of 2-0 plain.  The skin was reapproximated with a subcuticular suture of 3-0 monocryl.  Steristrips were applied.  Instrument, sponge, and needle counts were correct prior to abdominal closure and at the conclusion of the case.  The patient was awaiting transfer to the recovery room in good condition.  Findings: Twin A: Live female infant with Apgars 9 at one minute and 9 at five minutes.  Twin B:Live female infant with Apgars 8 at one minute and 9 at five minutes.  Normal appearing bilateral ovaries and fallopian tubes were noted.  Estimated Blood Loss:  800 ml         Drains: foley to gravity 50 cc         Total IV Fluids: 1400 ml         Specimens to Pathology: Placenta and  Right Paratubal Cyst         Complications:  None; patient tolerated the procedure well.         Disposition: PACU - hemodynamically stable.         Condition: stable  Attending Attestation: I performed the procedure.

## 2015-09-12 DIAGNOSIS — O30009 Twin pregnancy, unspecified number of placenta and unspecified number of amniotic sacs, unspecified trimester: Secondary | ICD-10-CM | POA: Diagnosis present

## 2015-09-12 DIAGNOSIS — Z98891 History of uterine scar from previous surgery: Secondary | ICD-10-CM

## 2015-09-12 DIAGNOSIS — O30003 Twin pregnancy, unspecified number of placenta and unspecified number of amniotic sacs, third trimester: Secondary | ICD-10-CM | POA: Diagnosis present

## 2015-09-12 LAB — CBC
HEMATOCRIT: 33.7 % — AB (ref 36.0–46.0)
HEMOGLOBIN: 11.2 g/dL — AB (ref 12.0–15.0)
MCH: 28.2 pg (ref 26.0–34.0)
MCHC: 33.2 g/dL (ref 30.0–36.0)
MCV: 84.9 fL (ref 78.0–100.0)
Platelets: 229 10*3/uL (ref 150–400)
RBC: 3.97 MIL/uL (ref 3.87–5.11)
RDW: 13.5 % (ref 11.5–15.5)
WBC: 18.4 10*3/uL — AB (ref 4.0–10.5)

## 2015-09-12 LAB — RPR: RPR Ser Ql: NONREACTIVE

## 2015-09-12 LAB — HIV ANTIBODY (ROUTINE TESTING W REFLEX): HIV SCREEN 4TH GENERATION: NONREACTIVE

## 2015-09-12 MED ORDER — TRAMADOL HCL 50 MG PO TABS
50.0000 mg | ORAL_TABLET | Freq: Four times a day (QID) | ORAL | Status: DC | PRN
  Administered 2015-09-12 – 2015-09-14 (×6): 50 mg via ORAL
  Filled 2015-09-12 (×7): qty 1

## 2015-09-12 MED ORDER — ACETAMINOPHEN 325 MG PO TABS
650.0000 mg | ORAL_TABLET | Freq: Four times a day (QID) | ORAL | Status: DC | PRN
  Administered 2015-09-12 – 2015-09-14 (×7): 650 mg via ORAL
  Filled 2015-09-12 (×8): qty 2

## 2015-09-12 NOTE — Progress Notes (Addendum)
Ashley Perez [id number]  Subjective: Postpartum Day 1: Primary LTC/S due to di/di twins, SROM, breech/breech presentation, IUGR both twins Patient up ad lib, reports no syncope or dizziness.  Babies doing well in NICU.  Patient reports minimal pain. Foley removed this am, with spontaneous void since removal. Feeding:  Plans bottle feeding Contraceptive plan:  Mirena  No CBC ordered for today.  Sensitive to Percocet--orders for Tramadol for prn use given during night.  Objective: Temp:  [96.5 F (35.8 C)-98.2 F (36.8 C)] 97.4 F (36.3 C) (12/03 0617) Pulse Rate:  [57-99] 66 (12/03 0617) Resp:  [12-24] 18 (12/03 0617) BP: (66-156)/(49-132) 126/81 mmHg (12/03 0617) SpO2:  [95 %-100 %] 100 % (12/03 0617) Weight:  [173 lb (78.472 kg)] 173 lb (78.472 kg) (12/02 1431)   Filed Vitals:   09/12/15 0353 09/12/15 0403 09/12/15 0405 09/12/15 0617  BP: 126/81 122/87 118/72 126/81  Pulse: 70 70 91 66  Temp: 98 F (36.7 C)   97.4 F (36.3 C)  TempSrc: Axillary   Oral  Resp: 18 18 18 18   Height:      Weight:      SpO2: 99%   100%    CBC Latest Ref Rng 09/11/2015  WBC 4.0 - 10.5 K/uL 11.9(H)  Hemoglobin 12.0 - 15.0 g/dL 08.613.8  Hematocrit 57.836.0 - 46.0 % 40.1  Platelets 150 - 400 K/uL 279   PCR 0.11 yesterday.   Physical Exam:  General: alert Lochia: appropriate Uterine Fundus: firm Abdomen:  + bowel sounds Incision: Honeycomb dressing CDI DVT Evaluation: No evidence of DVT seen on physical exam. Negative Homan's sign.   Assessment/Plan: Status post cesarean delivery, day 1--twin pregnancy, PTD, IUGR both twins. Stable Continue current care. CBC ordered for this am.    Nigel BridgemanLATHAM, Greydon Betke MSN, CNM 09/12/2015, 8:48 AM

## 2015-09-12 NOTE — Progress Notes (Signed)
0630 am Night shift Rn Called Midwife regarding pain medicine for patient. Midwife ordered tylenol and ultram.

## 2015-09-13 NOTE — Progress Notes (Signed)
Ashley Perez [id number]  Subjective: Postpartum Day 2: Primary LTC/S due to di/di twins, breech/breech, SROM, IUGR both twins.  Had been scheduled for C/S 09/14/15 per MFM recommendation. Patient up ad lib, reports no syncope or dizziness.  More sore than yesterday, but pain med having benefit. Feeding:  Bottle Contraceptive plan:  Mirena  Babies stable in NICU.  Objective: Temp:  [98.1 F (36.7 C)-98.3 F (36.8 C)] 98.1 F (36.7 C) (12/04 0540) Pulse Rate:  [70-86] 70 (12/04 0540) Resp:  [18] 18 (12/04 0540) BP: (115-130)/(62-79) 130/78 mmHg (12/04 0540)  CBC Latest Ref Rng 09/12/2015 09/11/2015  WBC 4.0 - 10.5 K/uL 18.4(H) 11.9(H)  Hemoglobin 12.0 - 15.0 g/dL 11.2(L) 13.8  Hematocrit 36.0 - 46.0 % 33.7(L) 40.1  Platelets 150 - 400 K/uL 229 279     Physical Exam:  General: alert Lochia: appropriate Uterine Fundus: firm Abdomen:  + bowel sounds Incision: Honeycomb dressing CDI DVT Evaluation: No evidence of DVT seen on physical exam. Negative Homan's sign.   Assessment/Plan: Status post cesarean delivery, day 2--twins, IUGR, PTD Stable Continue current care. Plan for discharge tomorrow Support to patient for NICU infants.   Nigel BridgemanLATHAM, Theresea Trautmann MSN, CNM 09/13/2015, 9:52 AM

## 2015-09-13 NOTE — Addendum Note (Signed)
Addendum  created 09/13/15 1002 by Graciela HusbandsWynn O Minnette Merida, CRNA   Modules edited: Notes Section   Notes Section:  File: [id number]

## 2015-09-13 NOTE — Anesthesia Postprocedure Evaluation (Signed)
Anesthesia Post Note  Patient: Ashley Perez  Procedure(s) Performed: Procedure(s) (LRB): CESAREAN SECTION (N/A)  Patient location during evaluation: Mother Baby Anesthesia Type: Spinal Level of consciousness: awake and alert and oriented Pain management: satisfactory to patient Vital Signs Assessment: post-procedure vital signs reviewed and stable Respiratory status: spontaneous breathing and respiratory function stable Cardiovascular status: stable Postop Assessment: No headache, No backache, Patient able to bend at knees, No signs of nausea or vomiting and Adequate PO intake Anesthetic complications: no    Last Vitals:  Filed Vitals:   09/12/15 1554 09/13/15 0540  BP: 123/79 130/78  Pulse: 86 70  Temp: 36.8 C 36.7 C  Resp: 18 18    Last Pain:  Filed Vitals:   09/13/15 0843  PainSc: 4                  Emaline Karnes

## 2015-09-14 ENCOUNTER — Inpatient Hospital Stay (HOSPITAL_COMMUNITY)
Admission: RE | Admit: 2015-09-14 | Payer: BLUE CROSS/BLUE SHIELD | Source: Ambulatory Visit | Admitting: Obstetrics & Gynecology

## 2015-09-14 ENCOUNTER — Encounter (HOSPITAL_COMMUNITY): Payer: Self-pay | Admitting: Obstetrics and Gynecology

## 2015-09-14 SURGERY — Surgical Case
Anesthesia: Regional

## 2015-09-14 MED ORDER — MEASLES, MUMPS & RUBELLA VAC ~~LOC~~ INJ
0.5000 mL | INJECTION | Freq: Once | SUBCUTANEOUS | Status: AC
  Administered 2015-09-14: 0.5 mL via SUBCUTANEOUS
  Filled 2015-09-14: qty 0.5

## 2015-09-14 MED ORDER — PNEUMOCOCCAL VAC POLYVALENT 25 MCG/0.5ML IJ INJ
0.5000 mL | INJECTION | INTRAMUSCULAR | Status: AC
  Administered 2015-09-14: 0.5 mL via INTRAMUSCULAR
  Filled 2015-09-14: qty 0.5

## 2015-09-14 MED ORDER — ACETAMINOPHEN 325 MG PO TABS: 650.0000 mg | ORAL_TABLET | Freq: Four times a day (QID) | ORAL | Status: DC | PRN

## 2015-09-14 MED ORDER — IBUPROFEN 600 MG PO TABS: 600.0000 mg | ORAL_TABLET | Freq: Four times a day (QID) | ORAL | Status: DC | PRN

## 2015-09-14 MED ORDER — TRAMADOL HCL 50 MG PO TABS: 50.0000 mg | ORAL_TABLET | Freq: Four times a day (QID) | ORAL | Status: DC | PRN

## 2015-09-14 NOTE — Discharge Instructions (Signed)
Postpartum Care After Cesarean Delivery °After you deliver your newborn (postpartum period), the usual stay in the hospital is 24-72 hours. If there were problems with your labor or delivery, or if you have other medical problems, you might be in the hospital longer.  °While you are in the hospital, you will receive help and instructions on how to care for yourself and your newborn during the postpartum period.  °While you are in the hospital: °· It is normal for you to have pain or discomfort from the incision in your abdomen. Be sure to tell your nurses when you are having pain, where the pain is located, and what makes the pain worse. °· If you are breastfeeding, you may feel uncomfortable contractions of your uterus for a couple of weeks. This is normal. The contractions help your uterus get back to normal size. °· It is normal to have some bleeding after delivery. °¨ For the first 1-3 days after delivery, the flow is red and the amount may be similar to a period. °¨ It is common for the flow to start and stop. °¨ In the first few days, you may pass some small clots. Let your nurses know if you begin to pass large clots or your flow increases. °¨ Do not  flush blood clots down the toilet before having the nurse look at them. °¨ During the next 3-10 days after delivery, your flow should become more watery and pink or brown-tinged in color. °¨ Ten to fourteen days after delivery, your flow should be a small amount of yellowish-white discharge. °¨ The amount of your flow will decrease over the first few weeks after delivery. Your flow may stop in 6-8 weeks. Most women have had their flow stop by 12 weeks after delivery. °· You should change your sanitary pads frequently. °· Wash your hands thoroughly with soap and water for at least 20 seconds after changing pads, using the toilet, or before holding or feeding your newborn. °· Your intravenous (IV) tubing will be removed when you are drinking enough fluids. °· The  urine drainage tube (urinary catheter) that was inserted before delivery may be removed within 6-8 hours after delivery or when feeling returns to your legs. You should feel like you need to empty your bladder within the first 6-8 hours after the catheter has been removed. °· In case you become weak, lightheaded, or faint, call your nurse before you get out of bed for the first time and before you take a shower for the first time. °· Within the first few days after delivery, your breasts may begin to feel tender and full. This is called engorgement. Breast tenderness usually goes away within 48-72 hours after engorgement occurs. You may also notice milk leaking from your breasts. If you are not breastfeeding, do not stimulate your breasts. Breast stimulation can make your breasts produce more milk. °· Spending as much time as possible with your newborn is very important. During this time, you and your newborn can feel close and get to know each other. Having your newborn stay in your room (rooming in) will help to strengthen the bond with your newborn. It will give you time to get to know your newborn and become comfortable caring for your newborn. °· Your hormones change after delivery. Sometimes the hormone changes can temporarily cause you to feel sad or tearful. These feelings should not last more than a few days. If these feelings last longer than that, you should talk to your   caregiver.  If desired, talk to your caregiver about methods of family planning or contraception.  Talk to your caregiver about immunizations. Your caregiver may want you to have the following immunizations before leaving the hospital:  Tetanus, diphtheria, and pertussis (Tdap) or tetanus and diphtheria (Td) immunization. It is very important that you and your family (including grandparents) or others caring for your newborn are up-to-date with the Tdap or Td immunizations. The Tdap or Td immunization can help protect your newborn  from getting ill.  Rubella immunization.  Varicella (chickenpox) immunization.  Influenza immunization. You should receive this annual immunization if you did not receive the immunization during your pregnancy.   This information is not intended to replace advice given to you by your health care provider. Make sure you discuss any questions you have with your health care provider.   Document Released: 06/20/2012 Document Reviewed: 06/20/2012 Elsevier Interactive Patient Education [employer] Levonorgestrel intrauterine device (IUD) What is this medicine? LEVONORGESTREL IUD (LEE voe nor jes trel) is a contraceptive (birth control) device. The device is placed inside the uterus by a healthcare professional. It is used to prevent pregnancy and can also be used to treat heavy bleeding that occurs during your period. Depending on the device, it can be used for 3 to 5 years. This medicine may be used for other purposes; ask your health care provider or pharmacist if you have questions. What should I tell my health care provider before I take this medicine? They need to know if you have any of these conditions: -abnormal Pap smear -cancer of the breast, uterus, or cervix -diabetes -endometritis -genital or pelvic infection now or in the past -have more than one sexual partner or your partner has more than one partner -heart disease -history of an ectopic or tubal pregnancy -immune system problems -IUD in place -liver disease or tumor -problems with blood clots or take blood-thinners -use intravenous drugs -uterus of unusual shape -vaginal bleeding that has not been explained -an unusual or allergic reaction to levonorgestrel, other hormones, silicone, or polyethylene, medicines, foods, dyes, or preservatives -pregnant or trying to get pregnant -breast-feeding How should I use this medicine? This device is placed inside the uterus by a health care professional. Talk to your  pediatrician regarding the use of this medicine in children. Special care may be needed. Overdosage: If you think you have taken too much of this medicine contact a poison control center or emergency room at once. NOTE: This medicine is only for you. Do not share this medicine with others. What if I miss a dose? This does not apply. What may interact with this medicine? Do not take this medicine with any of the following medications: -amprenavir -bosentan -fosamprenavir This medicine may also interact with the following medications: -aprepitant -barbiturate medicines for inducing sleep or treating seizures -bexarotene -griseofulvin -medicines to treat seizures like carbamazepine, ethotoin, felbamate, oxcarbazepine, phenytoin, topiramate -modafinil -pioglitazone -rifabutin -rifampin -rifapentine -some medicines to treat HIV infection like atazanavir, indinavir, lopinavir, nelfinavir, tipranavir, ritonavir -St. John's wort -warfarin This list may not describe all possible interactions. Give your health care provider a list of all the medicines, herbs, non-prescription drugs, or dietary supplements you use. Also tell them if you smoke, drink alcohol, or use illegal drugs. Some items may interact with your medicine. What should I watch for while using this medicine? Visit your doctor or health care professional for regular check ups. See your doctor if you or your partner has sexual contact with others,  becomes HIV positive, or gets a sexual transmitted disease. This product does not protect you against HIV infection (AIDS) or other sexually transmitted diseases. You can check the placement of the IUD yourself by reaching up to the top of your vagina with clean fingers to feel the threads. Do not pull on the threads. It is a good habit to check placement after each menstrual period. Call your doctor right away if you feel more of the IUD than just the threads or if you cannot feel the threads  at all. The IUD may come out by itself. You may become pregnant if the device comes out. If you notice that the IUD has come out use a backup birth control method like condoms and call your health care provider. Using tampons will not change the position of the IUD and are okay to use during your period. What side effects may I notice from receiving this medicine? Side effects that you should report to your doctor or health care professional as soon as possible: -allergic reactions like skin rash, itching or hives, swelling of the face, lips, or tongue -fever, flu-like symptoms -genital sores -high blood pressure -no menstrual period for 6 weeks during use -pain, swelling, warmth in the leg -pelvic pain or tenderness -severe or sudden headache -signs of pregnancy -stomach cramping -sudden shortness of breath -trouble with balance, talking, or walking -unusual vaginal bleeding, discharge -yellowing of the eyes or skin Side effects that usually do not require medical attention (report to your doctor or health care professional if they continue or are bothersome): -acne -breast pain -change in sex drive or performance -changes in weight -cramping, dizziness, or faintness while the device is being inserted -headache -irregular menstrual bleeding within first 3 to 6 months of use -nausea This list may not describe all possible side effects. Call your doctor for medical advice about side effects. You may report side effects to FDA at 1-800-FDA-1088. Where should I keep my medicine? This does not apply. NOTE: This sheet is a summary. It may not cover all possible information. If you have questions about this medicine, talk to your doctor, pharmacist, or health care provider.    2016, Elsevier/Gold Standard. (2011-10-27 13:54:04)

## 2015-09-14 NOTE — Discharge Summary (Signed)
OB Discharge Summary     Patient Name: Ashley Perez DOB: [birth year] MRN: [id number]  Date of admission: 09/11/2015 Delivering MD:    Ashley Perez, Ashley Perez [[id number]]  Ashley Perez Ashley Perez [[id number]]  Ashley Perez    Date of discharge: 09/14/2015  Admitting diagnosis: 35WKS,WATER BROKE Intrauterine pregnancy: [redacted]w[redacted]d     Secondary diagnosis:  Principal Problem:   Status post primary low transverse cesarean section Active Problems:   Twins--di/di   Prior pregnancy complicated by IUGR, antepartum--both twins   HSV-2 (herpes simplex virus 2) infection   Cervical spine fracture (HCC)   Rubella non-immune status, antepartum   Smoker   Twin gestation in third trimester  Additional problems: Breech presentation     Discharge diagnosis: Preterm Pregnancy Delivered                                                                                                Post partum procedures:none  Augmentation: N/Perez  Complications: None  Hospital course:  Onset of Labor With Unplanned C/S  24 y.o. yo G1P0102 at [redacted]w[redacted]d was admitted in Latent Labor on 09/11/2015. Patient had Perez labor course significant for 35 5/7 wks with twins malpresentation, PPROM, IUGR and eval for preeclampsia.   Ashley Perez Rupture Time/Date:    Ashley Perez, Ashley Perez [[id number]]  4:43 PM   Ashley Perez, Ashley Perez [[id number]]  4:44 PM  ,   Ashley Perez, Ashley Perez [[id number]]  09/11/2015   Ashley Perez, Ashley Perez [[id number]]  09/11/2015    The patient went for cesarean section due to Malpresentation and Twin gestation,PPROM, IUGR, and delivered Perez Viable infant,   Ashley Perez, Ashley Perez [[id number]]  09/11/2015   Ashley Perez, Ashley Perez [[id number]]  09/11/2015  Details of operation can be found in separate operative note. Patient had an uncomplicated postpartum course.  She is ambulating,tolerating Perez regular diet, passing flatus, and urinating well.  Patient is discharged home in stable condition No discharge date for patient  encounter.Marland Kitchen   Physical exam  Filed Vitals:   09/12/15 1110 09/12/15 1554 09/13/15 0540 09/14/15 0607  BP: 115/62 123/79 130/78 124/73  Pulse: 79 86 70 83  Temp: 98.1 F (36.7 C) 98.3 F (36.8 C) 98.1 F (36.7 C) 98.2 F (36.8 C)  TempSrc: Oral Oral Oral Oral  Resp: Height:      Weight:      SpO2:    100%   General: alert and cooperative Lochia: appropriate Uterine Fundus: firm Incision: Healing well with no significant drainage DVT Evaluation: No evidence of DVT seen on physical exam. Negative Homan's sign. Labs: Lab Results  Component Value Date   WBC 18.4* 09/12/2015   HGB 11.2* 09/12/2015   HCT 33.7* 09/12/2015   MCV 84.9 09/12/2015   PLT 229 09/12/2015   CMP Latest Ref Rng 09/11/2015  Glucose 65 - 99 mg/dL 84  BUN 6 - 20 mg/dL 6  Creatinine 1.88 - 4.16 mg/dL 6.06  Sodium 301 - 601 mmol/L 129(L)  Potassium 3.5 - 5.1 mmol/L 3.7  Chloride 101 - 111 mmol/L 100(L)  CO2 22 - 32  mmol/L 19(L)  Calcium 8.9 - 10.3 mg/dL 1.6(X8.8(L)  Total Protein 6.5 - 8.1 g/dL 6.0(L)  Total Bilirubin 0.3 - 1.2 mg/dL 0.9(U0.2(L)  Alkaline Phos 38 - 126 U/L 117  AST 15 - 41 U/L 17  ALT 14 - 54 U/L 14    Discharge instruction: per After Visit Summary and "Ashley and Me Booklet".  After visit meds:    Medication List    TAKE these medications        acetaminophen 325 MG tablet  Commonly known as:  TYLENOL  Take 2 tablets (650 mg total) by mouth every 6 (six) hours as needed for moderate pain.     calcium carbonate 500 MG chewable tablet  Commonly known as:  TUMS - dosed in mg elemental calcium  Chew 2 tablets by mouth at bedtime as needed for indigestion or heartburn.     ibuprofen 600 MG tablet  Commonly known as:  ADVIL,MOTRIN  Take 1 tablet (600 mg total) by mouth every 6 (six) hours as needed for mild pain.     PRENATAL VITAMIN PO  Take by mouth daily.     traMADol 50 MG tablet  Commonly known as:  ULTRAM  Take 1 tablet (50 mg total) by mouth every 6 (six) hours  as needed for moderate pain.        Diet: routine diet  Activity: Advance as tolerated. Pelvic rest for 6 weeks.   Outpatient follow EA:VWUJup:Call the office to schedule Perez  postpartum visit at 5 weeks PP and Perez contraception visit the next week at 6 weeks PP. Follow up Appt:No future appointments. Follow up Visit:No Follow-up on file.  Postpartum contraception: IUD Mirena  Newborn Data:   Ashley FootmanHaddock, BoyA Ashley Perez [[id number]][[id number]]  Live born female  Birth Weight: 4 lb 2.7 oz (1890 g) APGAR: 9, 9   Ashley FramesHaddock, Ashley Perez Ashley Perez [[id number]][[id number]]  Live born female  Birth Weight: 3 lb 13 oz (1730 g) APGAR: 8, 9  Ashley Feeding: Bottle Disposition:NICU   09/14/2015 Alphonzo Severanceachel Terryl Niziolek, CNM

## 2015-09-14 NOTE — Progress Notes (Signed)
CSW acknowledges NICU admission.    Patient screened out for psychosocial assessment since none of the following apply:  Psychosocial stressors documented in mother or baby's chart  Gestation less than 32 weeks  Code at delivery   Critically ill infant  Infant with anomalies  Please contact the Clinical Social Worker if specific needs arise, or by MOB's request.       

## 2015-09-15 ENCOUNTER — Ambulatory Visit (HOSPITAL_COMMUNITY): Payer: BLUE CROSS/BLUE SHIELD

## 2015-09-22 ENCOUNTER — Ambulatory Visit (HOSPITAL_COMMUNITY): Payer: BLUE CROSS/BLUE SHIELD

## 2015-09-29 ENCOUNTER — Ambulatory Visit (HOSPITAL_COMMUNITY): Payer: BLUE CROSS/BLUE SHIELD

## 2015-10-06 ENCOUNTER — Ambulatory Visit (HOSPITAL_COMMUNITY): Payer: BLUE CROSS/BLUE SHIELD

## 2016-10-28 IMAGING — US US MFM UA ADDL GEST
1 series · 14 of 28 positions shown · non-contrast
Comparison: none

[Series 1: us mfm ua addl gest · 50 acquisitions, 14 frames shown]
[im 2/50]
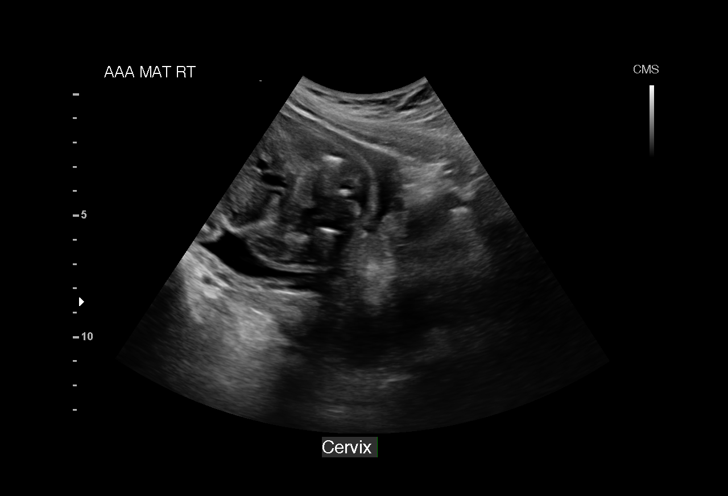
[im 6/50]
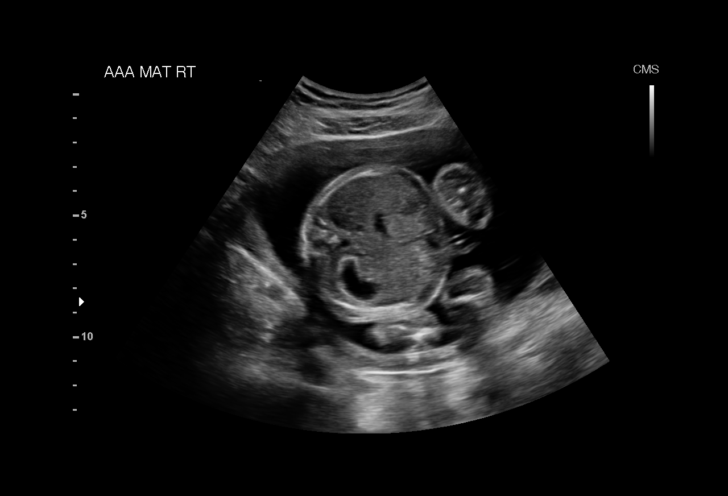
[im 10/50]
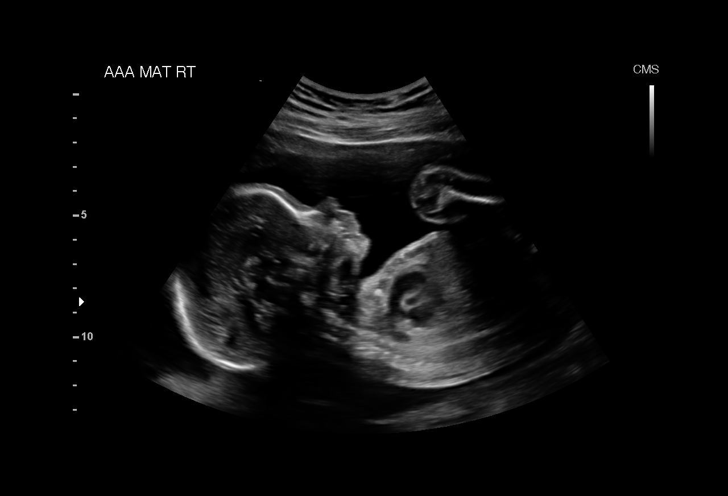
[im 13/50]
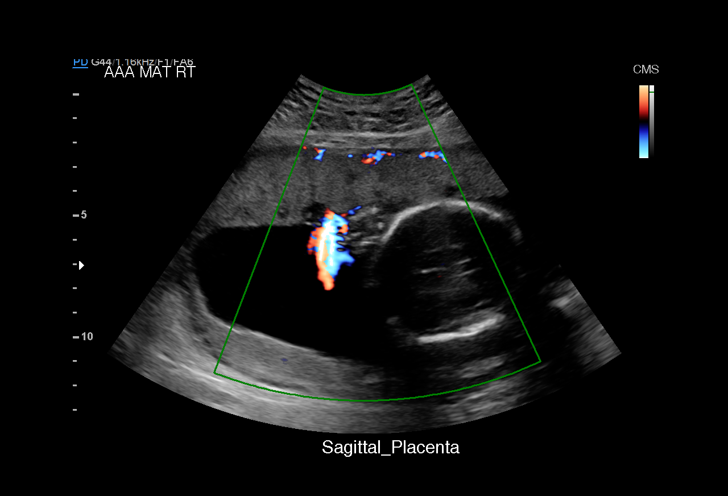
[im 17/50]
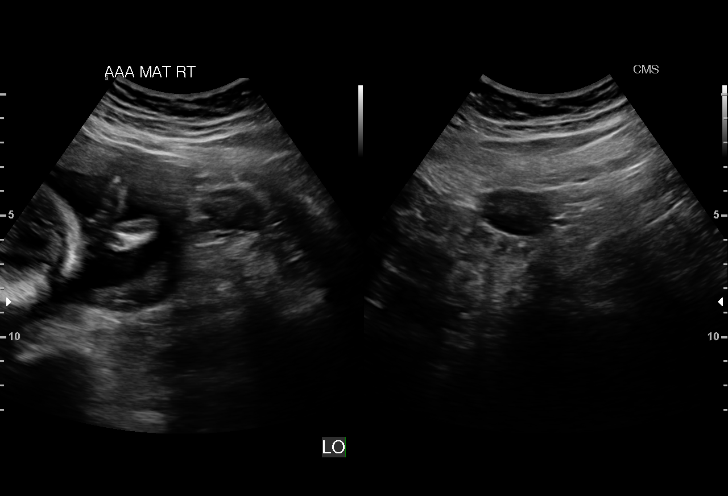
[im 20/50]
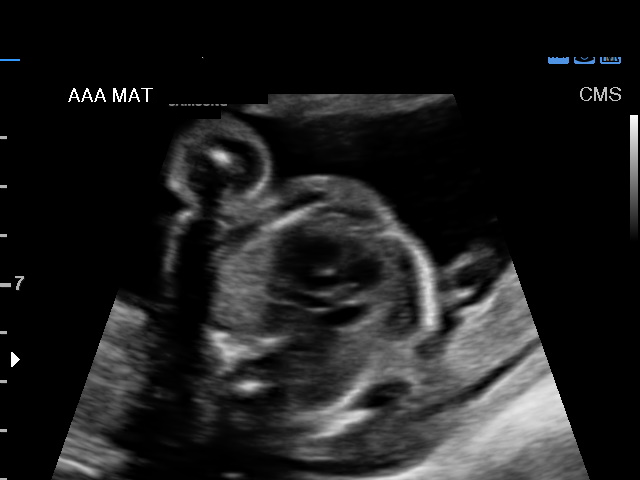
[im 24/50]
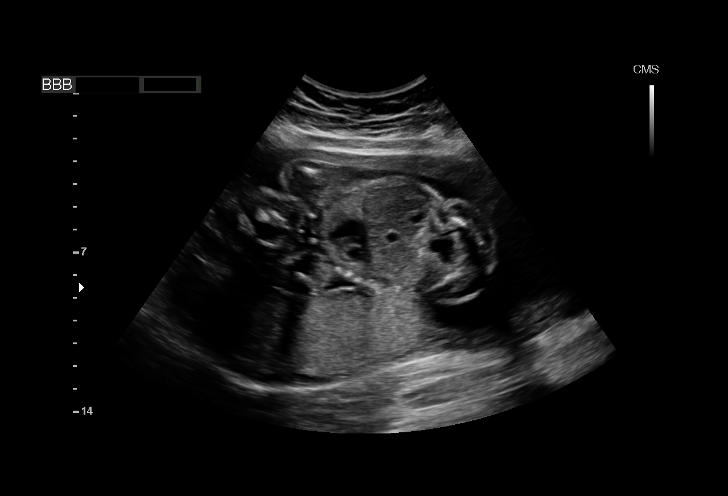
[im 28/50]
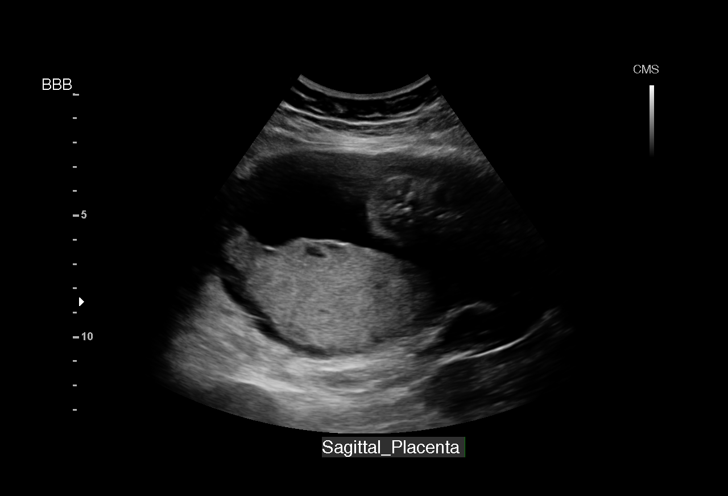
[im 31/50]
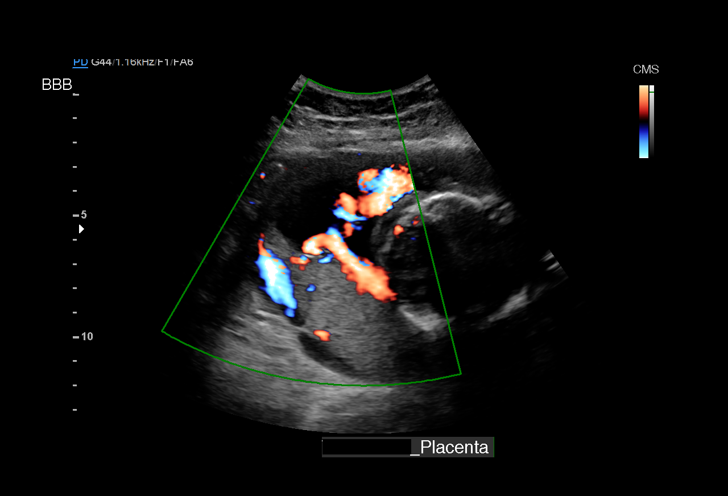
[im 35/50]
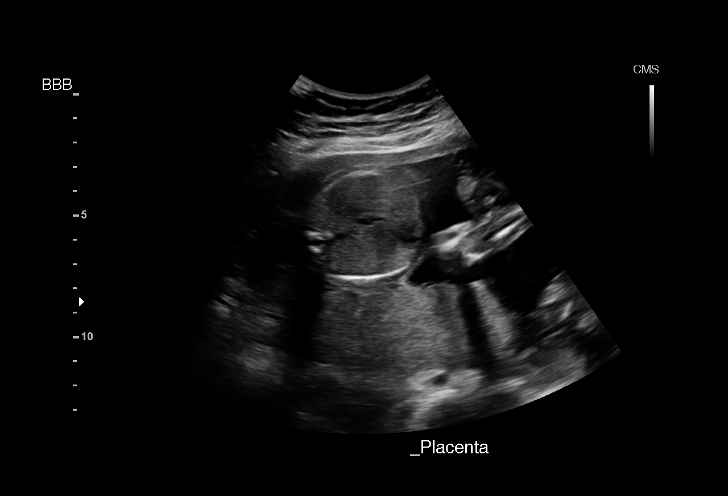
[im 39/50]
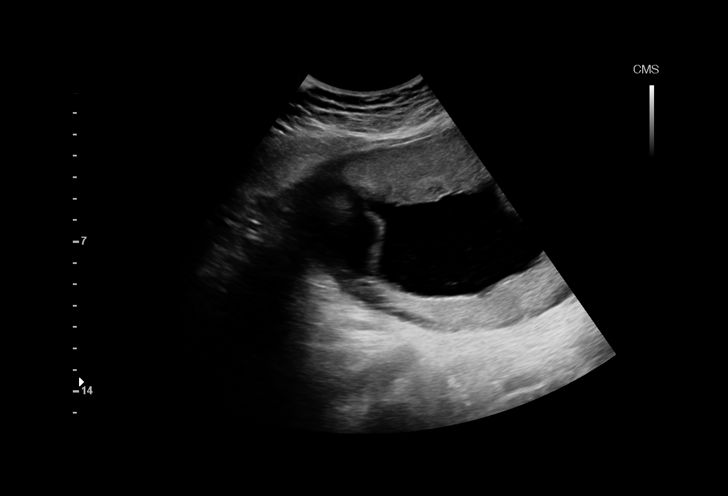
[im 42/50]
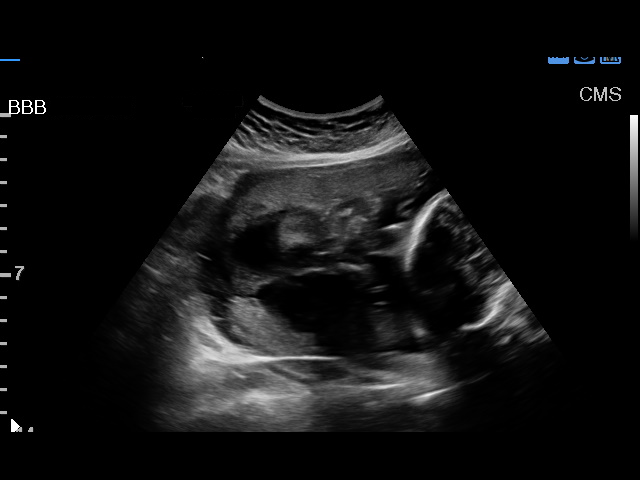
[im 46/50]
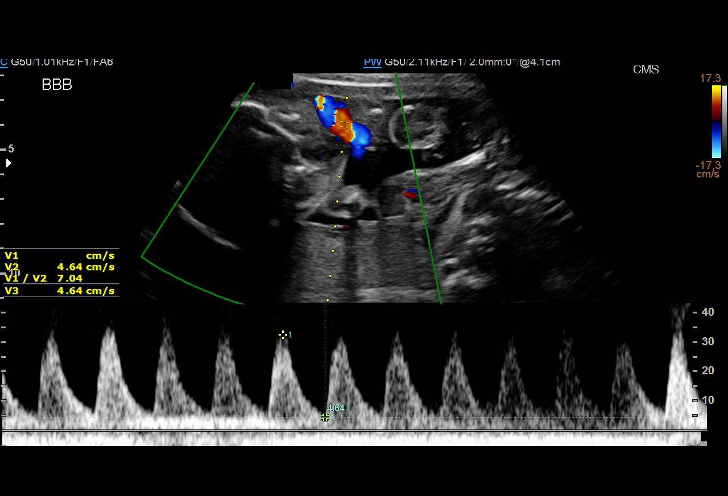
[im 50/50]
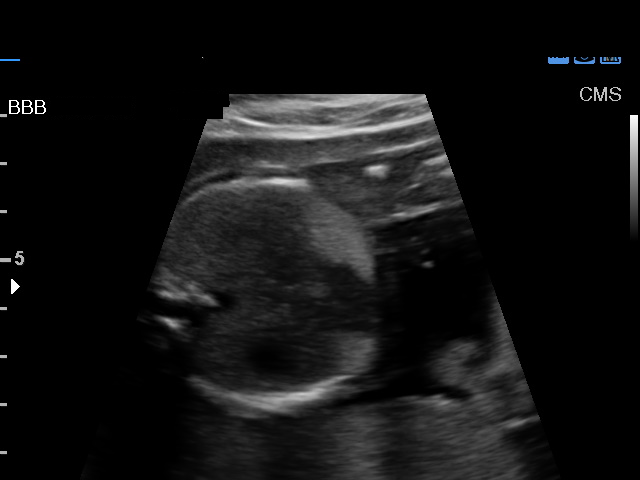

[14 of 28 positions shown; findings below may reference images not displayed]

OBSTETRICS REPORT
(Signed Final 06/25/2015 [DATE])

Date:

Service(s) Provided

US MFM UA CORD DOPPLER                                 76820.02
Indications

Twin pregnancy, di/di, second trimester
Twin B - Maternal care for known of suspected
poor fetal growth, second trimester, fetus 2
Tobacco use complicating pregnancy, second
trimester
24 weeks gestation of pregnancy
Fetal Evaluation (Fetus A)

Num Of             2
Fetuses:
Fetal Heart        150                          bpm
Rate:
Cardiac Activity:  Observed
Fetal Lie:         Lower Left Fetus
Presentation:      Breech
Placenta:          Anterior, above cervical
os
P. Cord            Visualized, central
Insertion:

Membrane           Dividing
Desc:              Membrane seen
- Dichorionic.

Amniotic Fluid
AFI FV:      Subjectively within normal limits
Larg Pckt:    4.64   cm
Gestational Age (Fetus A)

LMP:           24w 4d        Date:  01/04/15                  EDD:   10/11/15
Best:          24w 4d    Det. By:   LMP  (01/04/15)           EDD:   10/11/15
Doppler - Fetal Vessels (Fetus A)
Umbilical Artery
S/D:   4.34           83   %tile
Umbilical Artery
Absent DFV:     No    Reverse         No
DFV:

Fetal Evaluation (Fetus B)

Num Of             2
Fetuses:
Fetal Heart        154                          bpm
Rate:
Cardiac Activity:  Observed
Fetal Lie:         Upper Right Fetus
Presentation:      Breech
Placenta:          Posterior, above cervical
os
P. Cord            Visualized, central
Insertion:

Membrane           Dividing
Desc:              Membrane seen
- Dichorionic.

Amniotic Fluid
AFI FV:      Subjectively within normal limits
Larg Pckt:    3.02   cm
Gestational Age (Fetus B)

LMP:           24w 4d        Date:  01/04/15                  EDD:   10/11/15
Best:          24w 4d    Det. By:   LMP  (01/04/15)           EDD:   10/11/15
Doppler - Fetal Vessels (Fetus B)

Umbilical Artery
S/D:   5.91       > 97.5   %tile
Umbilical Artery
Absent DFV:     No    Reverse         No
DFV:

Cervix Uterus Adnexa

Cervical Length:    3.13      cm

Cervix:       Normal appearance by transabdominal scan.
Uterus:       No abnormality visualized.
Cul De Sac:   No free fluid seen.

Left Ovary:    Within normal limits.
Right Ovary:   Not visualized.

Adnexa:     No abnormality visualized.
Impression

Dichorionic diamniotic twin intrauterine pregnancy at 24
weeks 4 days.

Twin A
Normal amniotic fluid volume.
Normal umbilical artery Dopplers.

Twin B
Normal amniotic fluid volume.
Elevated umbilical artery S/D ratio without absent or
reversed end diastolic flow.
Recommendations

Continue weekly Dopplers.
Growth next week.

## 2016-11-04 IMAGING — US US MFM UA ADDL GEST
1 series · 10 of 10 positions shown · non-contrast
Comparison: none

[Series 1: us mfm ua addl gest · 10 of 10 slices shown]
[im 1/10]
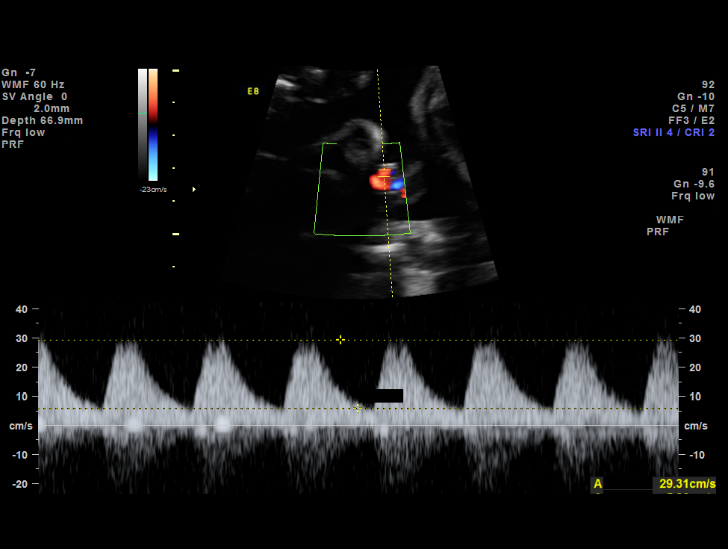
[im 2/10]
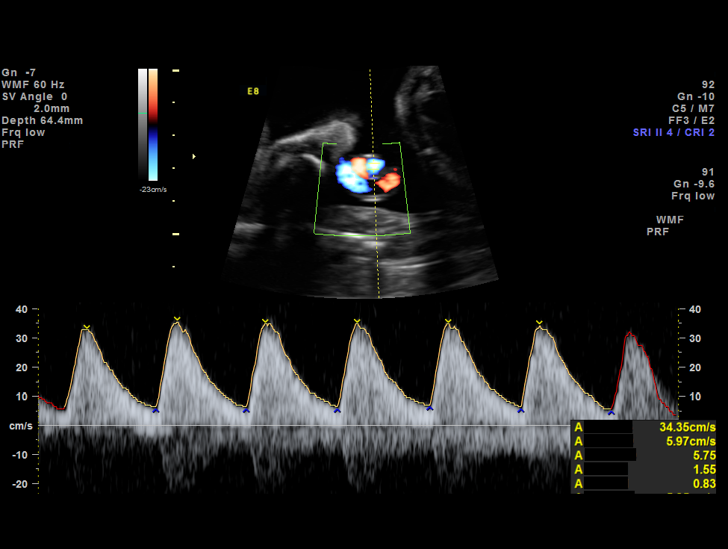
[im 3/10]
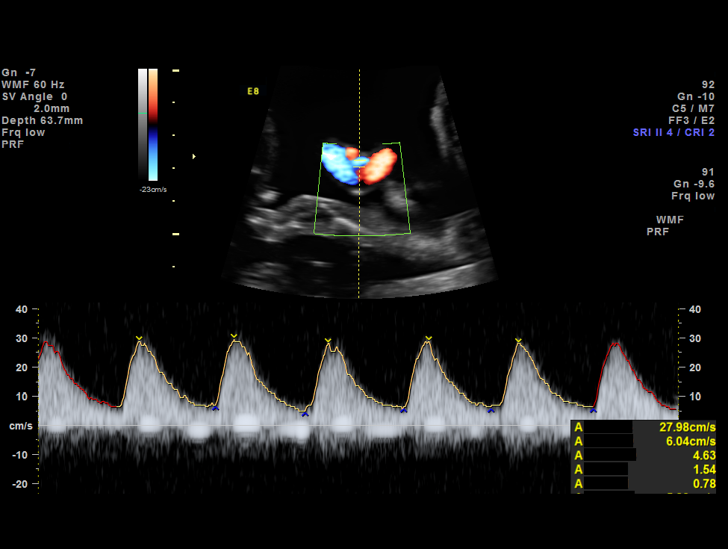
[im 4/10]
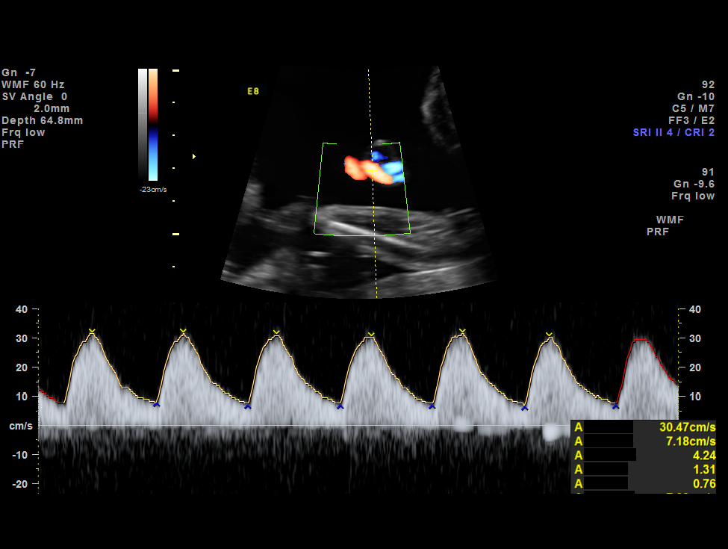
[im 5/10]
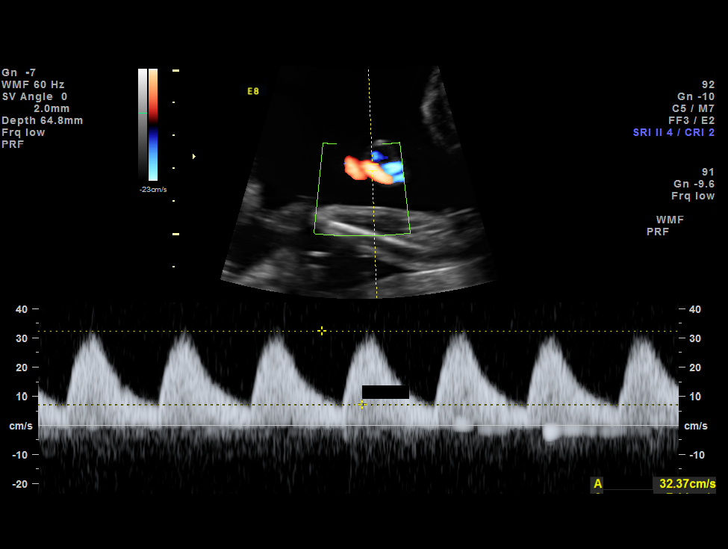
[im 6/10]
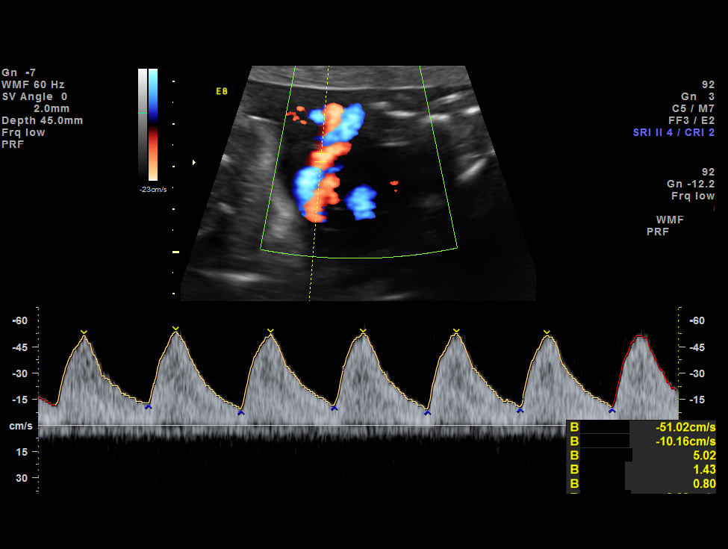
[im 7/10]
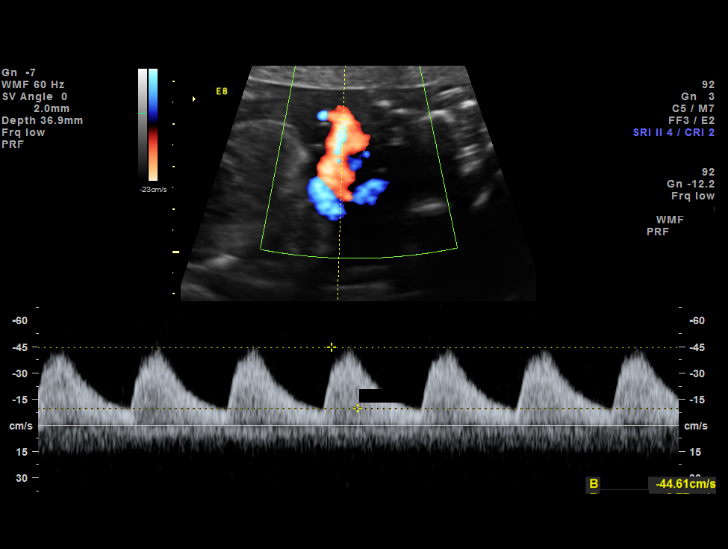
[im 8/10]
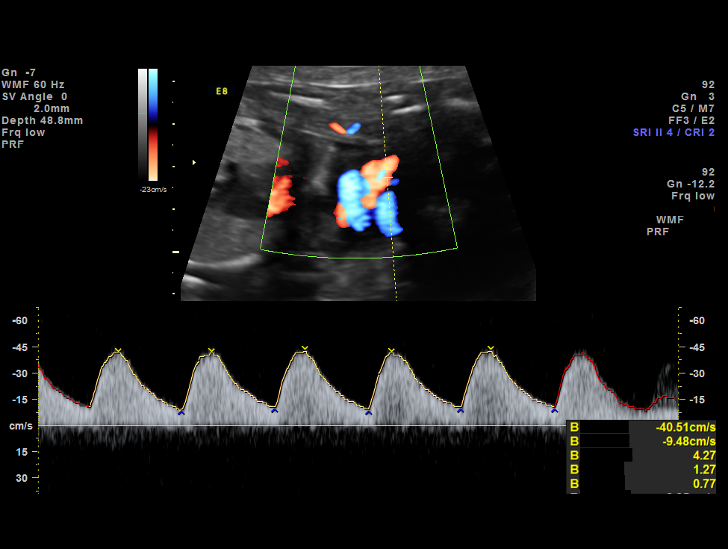
[im 9/10]
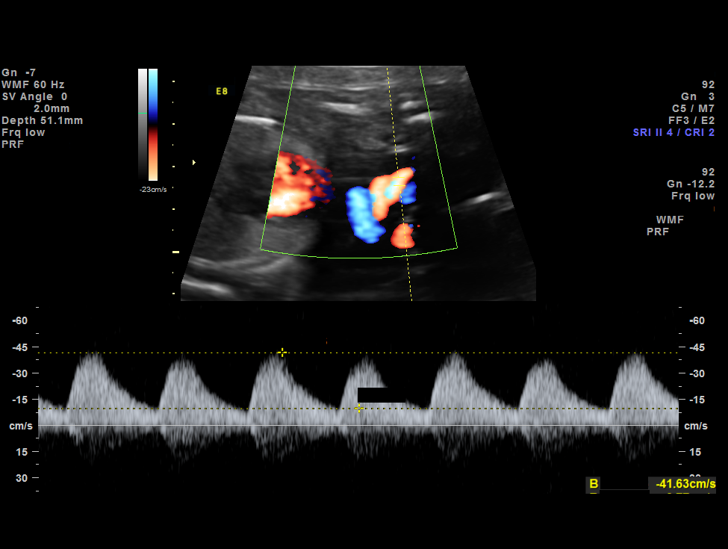
[im 10/10]
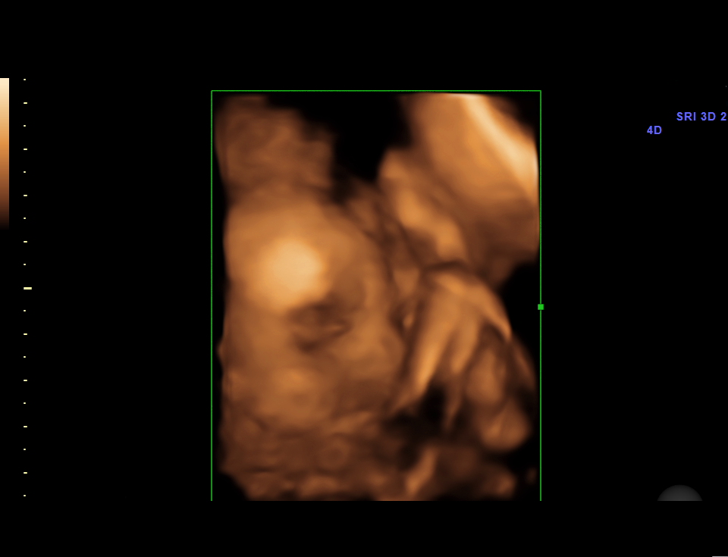

[10 of 10 positions shown; findings below may reference images not displayed]

OBSTETRICS REPORT
(Signed Final 07/02/2015 [DATE])

Date:

Service(s) Provided

US MFM UA CORD DOPPLER                                 76820.02
Indications

Twin pregnancy, di/di, second trimester
Twin B - Maternal care for known of suspected
poor fetal growth, second trimester, fetus 2
Tobacco use complicating pregnancy, second
trimester
25 weeks gestation of pregnancy
Fetal Evaluation (Fetus A)

Num Of             2
Fetuses:
Fetal Heart        142                          bpm
Rate:
Cardiac Activity:  Observed
Fetal Lie:         Left Fetus
Presentation:      Breech
Placenta:          Anterior, above cervical
os

Amniotic Fluid
AFI FV:      Subjectively within normal limits
Larg Pckt:      5.1  cm
Biometry (Fetus A)

BPD:     62.8   m    G. Age:   25w 3d                 CI:        76.67   70 - 86
m
FL/HC:      18.0   18.6 -
20.4
HC:     227.2   m    G. Age:   24w 5d         8  %    HC/AC:      1.17   1.04 -
m
AC:       194   m    G. Age:   24w 1d         8  %    FL/BPD      65.3   71 - 87
m                                     :
FL:        41   m    G. Age:   23w 2d       < 3  %    FL/AC:      21.1   20 - 24
m
HUM:       39   m    G. Age:   23w 6d       < 5  %
m
CER:     28.3   m    G. Age:   25w 2d        46  %
m

Est.         643   gm    1 lb 7 oz      21   %    FW Discordancy     0 \ 11  %
FW:
Gestational Age (Fetus A)

LMP:           25w 4d        Date:  01/04/15                  EDD:   10/11/15
U/S Today:     24w 3d                                         EDD:   10/19/15
Best:          25w 4d    Det. By:   LMP  (01/04/15)           EDD:   10/11/15
Anatomy (Fetus A)

Cranium:          Appears normal         Aortic Arch:       Appears normal
Fetal Cavum:      Appears normal         Ductal Arch:       Appears normal
Ventricles:       Appears normal         Diaphragm:         Appears normal
Choroid Plexus:   Appears normal         Stomach:           Appears normal,
left sided
Cerebellum:       Appears normal         Abdomen:           Appears normal
Posterior         Appears normal         Abdominal          Appears nml (cord
Fossa:                                   Wall:              insert, abd wall)
Nuchal Fold:      Not applicable (>20    Cord Vessels:      Appears normal (3
wks GA)                                   vessel cord)
Face:             Orbits and profile     Kidneys:           Appear normal
previously seen
Lips:             Previously seen        Bladder:           Appears normal
Heart:            Appears normal         Spine:             Previously seen
(4CH, axis, and
situs)
RVOT:             Appears normal         Lower              Previously seen
Extremities:
LVOT:             Appears normal         Upper              Previously seen
Extremities:

Other:   Fetus appears to be a male. Heels and 5th digit previously seen. .
Nasal bone visualized. Technically difficult due to fetal position.
Doppler - Fetal Vessels (Fetus A)

Umbilical Artery
S/D:   4.2            84   %tile
Umbilical Artery
Absent DFV:     No    Reverse         No
DFV:

Fetal Evaluation (Fetus B)

Num Of             2
Fetuses:
Fetal Heart        140                          bpm
Rate:
Cardiac Activity:  Observed
Fetal Lie:         Right Fetus
Presentation:      Breech
Placenta:          Posterior, above cervical
os

Amniotic Fluid
AFI FV:      Subjectively within normal limits
Larg Pckt:      6.8  cm
Biometry (Fetus B)

BPD:     58.2   m    G. Age:   23w 6d                 CI:        76.26   70 - 86
m
FL/HC:      19.3   18.6 -
20.4
HC:     211.2   m    G. Age:   23w 2d       < 3  %    HC/AC:      1.15   1.04 -
m
AC:     183.5   m    G. Age:   23w 1d       < 3  %    FL/BPD      70.1   71 - 87
m                                     :
FL:      40.8   m    G. Age:   23w 2d       < 3  %    FL/AC:      22.2   20 - 24
m
HUM:     38.8   m    G. Age:   23w 6d       < 5  %
m
CER:     25.4   m    G. Age:   23w 3d       < 5  %
m

Est.         574   gm    1 lb 4 oz      14   %    FW Discordancy         11  %
FW:
Gestational Age (Fetus B)

LMP:           25w 4d        Date:  01/04/15                  EDD:   10/11/15
U/S Today:     23w 3d                                         EDD:   10/26/15
Best:          25w 4d    Det. By:   LMP  (01/04/15)           EDD:   10/11/15
Anatomy (Fetus B)

Cranium:          Appears normal         Aortic Arch:       Previously seen
Fetal Cavum:      Appears normal         Ductal Arch:       Not well visualized
Ventricles:       Appears normal         Diaphragm:         Appears normal
Choroid Plexus:   Appears normal         Stomach:           Appears normal,
left sided
Cerebellum:       Appears normal         Abdomen:           Appears normal
Posterior         Appears normal         Abdominal          Appears nml (cord
Fossa:                                   Wall:              insert, abd wall)
Nuchal Fold:      Not applicable (>20    Cord Vessels:      Appears normal (3
wks GA)                                   vessel cord)
Face:             Orbits and profile     Kidneys:           Appear normal
previously seen
Lips:             Previously seen        Bladder:           Appears normal
Heart:            Appears normal         Spine:             Previously seen
(4CH, axis, and
situs)
RVOT:             Appears normal         Lower              Previously seen
Extremities:
LVOT:             Appears normal         Upper              Previously seen
Extremities:
Other:   Fetus appears to be a female. Heels and 5th digit visualized. Nasal
bone visualized. Technically difficult due to fetal position.
Doppler - Fetal Vessels (Fetus B)

Umbilical Artery
S/D:   4.3            88   %tile
Umbilical Artery
Absent DFV:     No    Reverse         No
DFV:

Cervix Uterus Adnexa

Cervical Length:    4.9       cm

Cervix:       Normal appearance by transabdominal scan.

Left Ovary:    Previously seen.
Right Ovary:   Previously seen
Impression

Dichorionic/diamniotic twin pregnancy at 25+4 weeks
Normal interval anatomy; anatomic survey complete x 2
except for DA on B
Normal amniotic fluid volume x 2
Twin A: EFW at the 21st %tile; AC at the 8th %tile
Twin B: EFW at the 14th %tile; AC < 3rd %tile
UA dopplers were in the high normal range for this GA x 2
Recommendations

Follow-up ultrasound in one week to reassess UA dopplers
Continue weekly UA dopplers
Ultrasound for growth in 3 weeks
Begin BPPs at 28 weeks

## 2020-12-10 ENCOUNTER — Inpatient Hospital Stay: Payer: Self-pay

## 2022-07-07 DIAGNOSIS — Z1151 Encounter for screening for human papillomavirus (HPV): Secondary | ICD-10-CM | POA: Diagnosis not present

## 2022-07-07 DIAGNOSIS — Z01411 Encounter for gynecological examination (general) (routine) with abnormal findings: Secondary | ICD-10-CM | POA: Diagnosis not present

## 2022-07-07 DIAGNOSIS — R87810 Cervical high risk human papillomavirus (HPV) DNA test positive: Secondary | ICD-10-CM | POA: Diagnosis not present

## 2022-07-07 DIAGNOSIS — N6311 Unspecified lump in the right breast, upper outer quadrant: Secondary | ICD-10-CM | POA: Diagnosis not present

## 2022-07-07 DIAGNOSIS — Z01419 Encounter for gynecological examination (general) (routine) without abnormal findings: Secondary | ICD-10-CM | POA: Diagnosis not present

## 2022-07-19 DIAGNOSIS — R928 Other abnormal and inconclusive findings on diagnostic imaging of breast: Secondary | ICD-10-CM | POA: Diagnosis not present

## 2022-07-19 DIAGNOSIS — Z01419 Encounter for gynecological examination (general) (routine) without abnormal findings: Secondary | ICD-10-CM | POA: Diagnosis not present

## 2022-07-19 DIAGNOSIS — N6311 Unspecified lump in the right breast, upper outer quadrant: Secondary | ICD-10-CM | POA: Diagnosis not present
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
## 2022-09-21 ENCOUNTER — Encounter (INDEPENDENT_AMBULATORY_CARE_PROVIDER_SITE_OTHER): Payer: Self-pay | Admitting: Ophthalmology

## 2022-09-21 ENCOUNTER — Ambulatory Visit (INDEPENDENT_AMBULATORY_CARE_PROVIDER_SITE_OTHER): Payer: BLUE CROSS/BLUE SHIELD | Admitting: Ophthalmology

## 2022-09-21 DIAGNOSIS — H4710 Unspecified papilledema: Secondary | ICD-10-CM | POA: Diagnosis not present

## 2022-09-21 DIAGNOSIS — G08 Intracranial and intraspinal phlebitis and thrombophlebitis: Secondary | ICD-10-CM

## 2022-09-21 DIAGNOSIS — H3581 Retinal edema: Secondary | ICD-10-CM

## 2022-10-06 NOTE — Progress Notes (Signed)
12.28.23 Update:  [facility] not accepting Neuro-Ophthalmology referrals at this time. Patient referred to [facility] Neuro-Ophthalmology who requested  - MRI orbits w/ contrast + MRV - Labs: HbA1c, CBC, Chem panel - HVF -- scheduled for Jan 4  MRI and Lab orders placed today  Karie Chimera, M.D., Ph.D. Diseases & Surgery of the Retina and Vitreous [facility]

## 2022-10-06 NOTE — Addendum Note (Signed)
Addended by: Karie Chimera on: 10/06/2022 04:22 PM   Modules accepted: Orders

## 2022-10-11 DIAGNOSIS — G08 Intracranial and intraspinal phlebitis and thrombophlebitis: Secondary | ICD-10-CM | POA: Diagnosis not present

## 2022-10-11 DIAGNOSIS — H4710 Unspecified papilledema: Secondary | ICD-10-CM | POA: Diagnosis not present
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
## 2022-10-12 LAB — CBC WITH DIFFERENTIAL
Basophils Absolute: 0 10*3/uL (ref 0.0–0.2)
Basos: 0 %
EOS (ABSOLUTE): 0.1 10*3/uL (ref 0.0–0.4)
Eos: 2 %
Hematocrit: 46.7 % — ABNORMAL HIGH (ref 34.0–46.6)
Hemoglobin: 15.1 g/dL (ref 11.1–15.9)
Immature Grans (Abs): 0 10*3/uL (ref 0.0–0.1)
Immature Granulocytes: 0 %
Lymphocytes Absolute: 3.8 10*3/uL — ABNORMAL HIGH (ref 0.7–3.1)
Lymphs: 49 %
MCH: 28.1 pg (ref 26.6–33.0)
MCHC: 32.3 g/dL (ref 31.5–35.7)
MCV: 87 fL (ref 79–97)
Monocytes Absolute: 0.4 10*3/uL (ref 0.1–0.9)
Monocytes: 5 %
Neutrophils Absolute: 3.5 10*3/uL (ref 1.4–7.0)
Neutrophils: 44 %
RBC: 5.38 x10E6/uL — ABNORMAL HIGH (ref 3.77–5.28)
RDW: 12.2 % (ref 11.7–15.4)
WBC: 7.8 10*3/uL (ref 3.4–10.8)

## 2022-10-12 LAB — COMPREHENSIVE METABOLIC PANEL
ALT: 13 IU/L (ref 0–32)
AST: 13 IU/L (ref 0–40)
Albumin/Globulin Ratio: 1.9 (ref 1.2–2.2)
Albumin: 4.5 g/dL (ref 3.9–4.9)
Alkaline Phosphatase: 69 IU/L (ref 44–121)
BUN/Creatinine Ratio: 17 (ref 9–23)
BUN: 12 mg/dL (ref 6–20)
Bilirubin Total: 0.3 mg/dL (ref 0.0–1.2)
CO2: 21 mmol/L (ref 20–29)
Calcium: 9.3 mg/dL (ref 8.7–10.2)
Chloride: 102 mmol/L (ref 96–106)
Creatinine, Ser: 0.69 mg/dL (ref 0.57–1.00)
Globulin, Total: 2.4 g/dL (ref 1.5–4.5)
Glucose: 108 mg/dL — ABNORMAL HIGH (ref 70–99)
Potassium: 4.3 mmol/L (ref 3.5–5.2)
Sodium: 138 mmol/L (ref 134–144)
Total Protein: 6.9 g/dL (ref 6.0–8.5)
eGFR: 117 mL/min/{1.73_m2} (ref >59)

## 2022-10-12 LAB — HEMOGLOBIN A1C
Est. average glucose Bld gHb Est-mCnc: 117 mg/dL
Hgb A1c MFr Bld: 5.7 % — ABNORMAL HIGH (ref 4.8–5.6)

## 2022-10-13 ENCOUNTER — Ambulatory Visit (INDEPENDENT_AMBULATORY_CARE_PROVIDER_SITE_OTHER): Payer: Medicaid Other | Admitting: Ophthalmology

## 2022-10-13 ENCOUNTER — Encounter (INDEPENDENT_AMBULATORY_CARE_PROVIDER_SITE_OTHER): Payer: Self-pay | Admitting: Ophthalmology

## 2022-10-13 DIAGNOSIS — H4710 Unspecified papilledema: Secondary | ICD-10-CM | POA: Diagnosis not present

## 2022-10-13 DIAGNOSIS — G08 Intracranial and intraspinal phlebitis and thrombophlebitis: Secondary | ICD-10-CM | POA: Diagnosis not present

## 2022-10-25 DIAGNOSIS — R87810 Cervical high risk human papillomavirus (HPV) DNA test positive: Secondary | ICD-10-CM | POA: Diagnosis not present

## 2022-10-25 DIAGNOSIS — N6311 Unspecified lump in the right breast, upper outer quadrant: Secondary | ICD-10-CM | POA: Diagnosis not present

## 2022-11-14 ENCOUNTER — Ambulatory Visit (HOSPITAL_COMMUNITY): Payer: BC Managed Care – PPO

## 2022-11-23 ENCOUNTER — Encounter (HOSPITAL_COMMUNITY): Payer: Self-pay

## 2022-11-23 ENCOUNTER — Ambulatory Visit (HOSPITAL_COMMUNITY): Payer: BC Managed Care – PPO

## 2022-12-28 ENCOUNTER — Encounter: Payer: Self-pay | Admitting: Nurse Practitioner

## 2022-12-28 ENCOUNTER — Ambulatory Visit (INDEPENDENT_AMBULATORY_CARE_PROVIDER_SITE_OTHER): Payer: BC Managed Care – PPO | Admitting: Nurse Practitioner

## 2022-12-28 VITALS — BP 120/73 | HR 64 | Ht 64.0 in | Wt 157.1 lb

## 2022-12-28 DIAGNOSIS — M67432 Ganglion, left wrist: Secondary | ICD-10-CM

## 2022-12-28 DIAGNOSIS — Z808 Family history of malignant neoplasm of other organs or systems: Secondary | ICD-10-CM | POA: Diagnosis not present

## 2022-12-28 DIAGNOSIS — D485 Neoplasm of uncertain behavior of skin: Secondary | ICD-10-CM

## 2022-12-28 DIAGNOSIS — F411 Generalized anxiety disorder: Secondary | ICD-10-CM

## 2022-12-28 DIAGNOSIS — Z7689 Persons encountering health services in other specified circumstances: Secondary | ICD-10-CM

## 2022-12-28 MED ORDER — BUSPIRONE HCL 5 MG PO TABS: 5.0000 mg | ORAL_TABLET | Freq: Two times a day (BID) | ORAL | 1 refills | Status: DC | PRN
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
## 2023-01-05 DIAGNOSIS — H4711 Papilledema associated with increased intracranial pressure: Secondary | ICD-10-CM | POA: Diagnosis not present

## 2023-01-09 DIAGNOSIS — F4312 Post-traumatic stress disorder, chronic: Secondary | ICD-10-CM | POA: Diagnosis not present

## 2023-01-18 ENCOUNTER — Ambulatory Visit (INDEPENDENT_AMBULATORY_CARE_PROVIDER_SITE_OTHER): Payer: BC Managed Care – PPO | Admitting: Nurse Practitioner

## 2023-01-18 ENCOUNTER — Encounter: Payer: Self-pay | Admitting: Nurse Practitioner

## 2023-01-18 VITALS — BP 128/84 | HR 81 | Ht 64.0 in | Wt 160.0 lb

## 2023-01-18 DIAGNOSIS — F411 Generalized anxiety disorder: Secondary | ICD-10-CM | POA: Diagnosis not present

## 2023-01-18 DIAGNOSIS — Z6827 Body mass index (BMI) 27.0-27.9, adult: Secondary | ICD-10-CM

## 2023-01-18 MED ORDER — BUSPIRONE HCL 10 MG PO TABS: 10.0000 mg | ORAL_TABLET | Freq: Two times a day (BID) | ORAL | 2 refills | Status: DC | PRN

## 2023-01-18 NOTE — Progress Notes (Unsigned)
Established patient visit   Patient: Ashley Perez   DOB: Jan 23, 1991   32 y.o. Female  MRN: [id number] Visit Date: 01/18/2023   Chief Complaint  Patient presents with   Medical Management of Chronic Issues   Subjective    HPI  Follow up  -generalized anxiety  -currently on buspirone 5 mg twice daily as needed which was started at her initial visit.  -states that this does help, but does not relieve her anxiety as well as she would like it to.   -no new concerns or complaints.  -She denies chest pain, chest pressure, or shortness of breath. she denies headaches or visual disturbances. She denies abdominal pain, nausea, vomiting, or changes in bowel or bladder habits.     Medications: Outpatient Medications Prior to Visit  Medication Sig   levonorgestrel (MIRENA, 52 MG,) 20 MCG/DAY IUD Take 1 device by intrauterine route as directed.   Multiple Vitamin (MULTIVITAMIN) capsule Take 1 capsule by mouth daily.   [DISCONTINUED] busPIRone (BUSPAR) 5 MG tablet Take 1 tablet (5 mg total) by mouth 2 (two) times daily as needed.   No facility-administered medications prior to visit.    Review of Systems See HPI    Last CBC Lab Results  Component Value Date   WBC 7.8 10/11/2022   HGB 15.1 10/11/2022   HCT 46.7 (H) 10/11/2022   MCV 87 10/11/2022   MCH 28.1 10/11/2022   RDW 12.2 10/11/2022   PLT 229 09/12/2015   Last metabolic panel Lab Results  Component Value Date   GLUCOSE 108 (H) 10/11/2022   NA 138 10/11/2022   K 4.3 10/11/2022   CL 102 10/11/2022   CO2 21 10/11/2022   BUN 12 10/11/2022   CREATININE 0.69 10/11/2022   EGFR 117 10/11/2022   CALCIUM 9.3 10/11/2022   PROT 6.9 10/11/2022   ALBUMIN 4.5 10/11/2022   LABGLOB 2.4 10/11/2022   AGRATIO 1.9 10/11/2022   BILITOT 0.3 10/11/2022   ALKPHOS 69 10/11/2022   AST 13 10/11/2022   ALT 13 10/11/2022   ANIONGAP 10 09/11/2015   Last lipids No results found for: "CHOL", "HDL", "LDLCALC", "LDLDIRECT", "TRIG",  "CHOLHDL" Last hemoglobin A1c Lab Results  Component Value Date   HGBA1C 5.7 (H) 10/11/2022        Objective     Today's Vitals   01/18/23 1418  BP: 128/84  Pulse: 81  SpO2: 100%  Weight: 160 lb (72.6 kg)  Height: 5\' 4"  (1.626 m)   Body mass index is 27.46 kg/m.  BP Readings from Last 3 Encounters:  01/18/23 128/84  12/28/22 120/73  09/14/15 124/73    Wt Readings from Last 3 Encounters:  01/18/23 160 lb (72.6 kg)  12/28/22 157 lb 1.9 oz (71.3 kg)  09/11/15 173 lb (78.5 kg)    Physical Exam Vitals and nursing note reviewed.  Constitutional:      Appearance: Normal appearance. She is well-developed.  HENT:     Head: Normocephalic and atraumatic.     Nose: Nose normal.     Mouth/Throat:     Mouth: Mucous membranes are moist.     Pharynx: Oropharynx is clear.  Eyes:     Extraocular Movements: Extraocular movements intact.     Conjunctiva/sclera: Conjunctivae normal.     Pupils: Pupils are equal, round, and reactive to light.  Neck:     Vascular: No carotid bruit.  Cardiovascular:     Rate and Rhythm: Normal rate and regular rhythm.     Pulses: Normal  pulses.     Heart sounds: Normal heart sounds.  Pulmonary:     Effort: Pulmonary effort is normal.     Breath sounds: Normal breath sounds.  Abdominal:     Palpations: Abdomen is soft.  Musculoskeletal:     Left wrist: Swelling, deformity and tenderness present. Decreased range of motion.     Cervical back: Normal range of motion and neck supple.  Lymphadenopathy:     Cervical: No cervical adenopathy.  Skin:    General: Skin is warm and dry.     Capillary Refill: Capillary refill takes less than 2 seconds.  Neurological:     General: No focal deficit present.     Mental Status: She is alert and oriented to person, place, and time.  Psychiatric:        Mood and Affect: Mood normal.        Behavior: Behavior normal.        Thought Content: Thought content normal.        Judgment: Judgment normal.       Assessment & Plan    Generalized anxiety disorder Assessment & Plan: Increase buspirone to 10 mg up to twice daily.  -reassess in 3 months and sooner if needed  Orders: -     busPIRone HCl; Take 1 tablet (10 mg total) by mouth 2 (two) times daily as needed.  Dispense: 60 tablet; Refill: 2  BMI 27.0-27.9,adult Assessment & Plan: Discussed lowering calorie intake to 1500 calories per day and incorporating exercise into daily routine to help lose weight.       Return in about 3 months (around 04/19/2023) for mood.         Carlean Jews, NP  [facility] at [facility] [phone] (phone) [phone] (fax)  [facility]

## 2023-01-23 DIAGNOSIS — H4711 Papilledema associated with increased intracranial pressure: Secondary | ICD-10-CM | POA: Diagnosis not present

## 2023-02-02 DIAGNOSIS — M542 Cervicalgia: Secondary | ICD-10-CM | POA: Diagnosis not present

## 2023-02-02 DIAGNOSIS — M4722 Other spondylosis with radiculopathy, cervical region: Secondary | ICD-10-CM | POA: Diagnosis not present

## 2023-02-05 DIAGNOSIS — F411 Generalized anxiety disorder: Secondary | ICD-10-CM | POA: Insufficient documentation

## 2023-02-05 DIAGNOSIS — M67432 Ganglion, left wrist: Secondary | ICD-10-CM | POA: Insufficient documentation

## 2023-02-05 DIAGNOSIS — Z808 Family history of malignant neoplasm of other organs or systems: Secondary | ICD-10-CM | POA: Insufficient documentation

## 2023-02-05 DIAGNOSIS — D485 Neoplasm of uncertain behavior of skin: Secondary | ICD-10-CM | POA: Insufficient documentation

## 2023-02-05 NOTE — Assessment & Plan Note (Signed)
Trial buspirone 5 mg twice daily as needed for acute anxiety. Reassess in 3 weeks for further evaluation.

## 2023-02-05 NOTE — Assessment & Plan Note (Signed)
Refer to dermatology for further evaluation. 

## 2023-02-05 NOTE — Assessment & Plan Note (Signed)
Refer to dermatology for general skin check.

## 2023-02-05 NOTE — Assessment & Plan Note (Signed)
Ganglion cyst getting larger and starting to interfere with ROM and strength of left hand and wrist. Refer to hand specialist for further evaluation

## 2023-02-06 DIAGNOSIS — R531 Weakness: Secondary | ICD-10-CM | POA: Diagnosis not present

## 2023-02-06 DIAGNOSIS — M4722 Other spondylosis with radiculopathy, cervical region: Secondary | ICD-10-CM | POA: Diagnosis not present

## 2023-02-06 DIAGNOSIS — M542 Cervicalgia: Secondary | ICD-10-CM | POA: Diagnosis not present

## 2023-02-06 DIAGNOSIS — R293 Abnormal posture: Secondary | ICD-10-CM | POA: Diagnosis not present

## 2023-02-06 DIAGNOSIS — M25511 Pain in right shoulder: Secondary | ICD-10-CM | POA: Diagnosis not present

## 2023-02-08 DIAGNOSIS — F4312 Post-traumatic stress disorder, chronic: Secondary | ICD-10-CM | POA: Diagnosis not present

## 2023-02-21 DIAGNOSIS — Z6827 Body mass index (BMI) 27.0-27.9, adult: Secondary | ICD-10-CM | POA: Insufficient documentation

## 2023-02-21 NOTE — Assessment & Plan Note (Addendum)
Increase buspirone to 10 mg up to twice daily.  -reassess in 3 months and sooner if needed

## 2023-02-21 NOTE — Assessment & Plan Note (Signed)
Discussed lowering calorie intake to 1500 calories per day and incorporating exercise into daily routine to help lose weight.  °

## 2023-02-23 DIAGNOSIS — H4711 Papilledema associated with increased intracranial pressure: Secondary | ICD-10-CM | POA: Diagnosis not present

## 2023-02-23 DIAGNOSIS — H4710 Unspecified papilledema: Secondary | ICD-10-CM | POA: Diagnosis not present

## 2023-02-28 ENCOUNTER — Encounter: Payer: Self-pay | Admitting: Dermatology

## 2023-02-28 ENCOUNTER — Ambulatory Visit: Payer: BC Managed Care – PPO | Admitting: Dermatology

## 2023-02-28 VITALS — BP 126/86

## 2023-02-28 DIAGNOSIS — L821 Other seborrheic keratosis: Secondary | ICD-10-CM

## 2023-02-28 DIAGNOSIS — Z86018 Personal history of other benign neoplasm: Secondary | ICD-10-CM

## 2023-02-28 DIAGNOSIS — X32XXXA Exposure to sunlight, initial encounter: Secondary | ICD-10-CM

## 2023-02-28 DIAGNOSIS — D1801 Hemangioma of skin and subcutaneous tissue: Secondary | ICD-10-CM | POA: Diagnosis not present

## 2023-02-28 DIAGNOSIS — L578 Other skin changes due to chronic exposure to nonionizing radiation: Secondary | ICD-10-CM

## 2023-02-28 DIAGNOSIS — W908XXA Exposure to other nonionizing radiation, initial encounter: Secondary | ICD-10-CM

## 2023-02-28 DIAGNOSIS — Z1283 Encounter for screening for malignant neoplasm of skin: Secondary | ICD-10-CM

## 2023-02-28 DIAGNOSIS — D225 Melanocytic nevi of trunk: Secondary | ICD-10-CM

## 2023-02-28 DIAGNOSIS — D492 Neoplasm of unspecified behavior of bone, soft tissue, and skin: Secondary | ICD-10-CM

## 2023-02-28 DIAGNOSIS — L814 Other melanin hyperpigmentation: Secondary | ICD-10-CM | POA: Diagnosis not present

## 2023-02-28 NOTE — Progress Notes (Signed)
   New Patient Visit   Subjective  Ashley Perez is a 32 y.o. female who presents for the following: Skin Cancer Screening and Full Body Skin Exam  The patient presents for Total-Body Skin Exam (TBSE) for skin cancer screening and mole check. The patient has spots, moles and lesions to be evaluated, some may be new or changing and the patient has concerns that these could be cancer.  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 She has never had a skin exam. She has an irritated mole at the mid back x 2-3 months. Itch is a 4. Possible Melanoma in the family.  The following portions of the chart were reviewed this encounter and updated as appropriate: medications, allergies, medical history  Review of Systems:  No other skin or systemic complaints except as noted in HPI or Assessment and Plan.  Objective  Well appearing patient in no apparent distress; mood and affect are within normal limits.  A full examination was performed including scalp, head, eyes, ears, nose, lips, neck, chest, axillae, abdomen, back, buttocks, bilateral upper extremities, bilateral lower extremities, hands, feet, fingers, toes, fingernails, and toenails. All findings within normal limits unless otherwise noted below.   Relevant physical exam findings are noted in the Assessment and Plan.    Assessment & Plan   LENTIGINES, SEBORRHEIC KERATOSES, HEMANGIOMAS - Benign normal skin lesions - Benign-appearing - Call for any changes  MELANOCYTIC NEVI - Tan-brown and/or pink-flesh-colored symmetric macules and papules - Benign appearing on exam today - Observation - Call clinic for new or changing moles - Recommend daily use of broad spectrum spf 30+ sunscreen to sun-exposed areas.   ACTINIC DAMAGE - Chronic condition, secondary to cumulative UV/sun exposure - diffuse scaly erythematous macules with underlying dyspigmentation - Recommend daily broad  spectrum sunscreen SPF 30+ to sun-exposed areas, reapply every 2 hours as needed.  - Staying in the shade or wearing long sleeves, sun glasses (UVA+UVB protection) and wide brim hats (4-inch brim around the entire circumference of the hat) are also recommended for sun protection.  - Call for new or changing lesions.     SKIN CANCER SCREENING PERFORMED TODAY.      No follow-ups on file.  Jaclynn Guarneri, CMA, am acting as scribe for [employer], DO.   Documentation: I have reviewed the above documentation for accuracy and completeness, and I agree with the above.  Langston Reusing, DO

## 2023-02-28 NOTE — Patient Instructions (Addendum)
Patient Handout: Wound Care for Skin Biopsy Site  Patient Handout: Wound Care for Skin Biopsy Site  Taking Care of Your Skin Biopsy Site  Proper care of the biopsy site is essential for promoting healing and minimizing scarring. This handout provides instructions on how to care for your biopsy site to ensure optimal recovery.  1. Cleaning the Wound:  Clean the biopsy site daily with gentle soap and water. Gently pat the area dry with a clean, soft towel. Avoid harsh scrubbing or rubbing the area, as this can irritate the skin and delay healing.  2. Applying Aquaphor and Bandage:  After cleaning the wound, apply a thin layer of Aquaphor ointment to the biopsy site. Cover the area with a sterile bandage to protect it from dirt, bacteria, and friction. Change the bandage daily or as needed if it becomes soiled or wet.  3. Continued Care for One Week:  Repeat the cleaning, Aquaphor application, and bandaging process daily for one week following the biopsy procedure. Keeping the wound clean and moist during this initial healing period will help prevent infection and promote optimal healing.  4. Massaging Aquaphor into the Area:  ---After one week, discontinue the use of bandages but continue to apply Aquaphor to the biopsy site. ----Gently massage the Aquaphor into the area using circular motions. ---Massaging the skin helps to promote circulation and prevent the formation of scar tissue.   Additional Tips:  Avoid exposing the biopsy site to direct sunlight during the healing process, as this can cause hyperpigmentation or worsen scarring. If you experience any signs of infection, such as increased redness, swelling, warmth, or drainage from the wound, contact your healthcare provider immediately. Follow any additional instructions provided by your healthcare provider for caring for the biopsy site and managing any discomfort. Conclusion:  Taking proper care of your skin biopsy site  is crucial for ensuring optimal healing and minimizing scarring. By following these instructions for cleaning, applying Aquaphor, and massaging the area, you can promote a smooth and successful recovery. If you have any questions or concerns about caring for your biopsy site, don't hesitate to contact your healthcare provider for guidance.    Due to recent changes in healthcare laws, you may see results of your pathology and/or laboratory studies on MyChart before the doctors have had a chance to review them. We understand that in some cases there may be results that are confusing or concerning to you. Please understand that not all results are received at the same time and often the doctors may need to interpret multiple results in order to provide you with the best plan of care or course of treatment. Therefore, we ask that you please give [place] 2 business days to thoroughly review all your results before contacting the office for clarification. Should we see a critical lab result, you will be contacted sooner.   If You Need Anything After Your Visit  If you have any questions or concerns for your doctor, please call our main line at [phone] If no one answers, please leave a voicemail as directed and we will return your call as soon as possible. Messages left after 4 pm will be answered the following business day.   You may also send [place] a message via MyChart. We typically respond to MyChart messages within 1-2 business days.  For prescription refills, please ask your pharmacy to contact our office. Our fax number is [phone].  If you have an urgent issue when the clinic is closed that  cannot wait until the next business day, you can page your doctor at the number below.    Please note that while we do our best to be available for urgent issues outside of office hours, we are not available 24/7.   If you have an urgent issue and are unable to reach [place], you may choose to seek medical care at  your doctor's office, retail clinic, urgent care center, or emergency room.  If you have a medical emergency, please immediately call 911 or go to the emergency department. In the event of inclement weather, please call our main line at [phone] for an update on the status of any delays or closures.  Dermatology Medication Tips: Please keep the boxes that topical medications come in in order to help keep track of the instructions about where and how to use these. Pharmacies typically print the medication instructions only on the boxes and not directly on the medication tubes.   If your medication is too expensive, please contact our office at [phone] or send [place] a message through MyChart.   We are unable to tell what your co-pay for medications will be in advance as this is different depending on your insurance coverage. However, we may be able to find a substitute medication at lower cost or fill out paperwork to get insurance to cover a needed medication.   If a prior authorization is required to get your medication covered by your insurance company, please allow [place] 1-2 business days to complete this process.  Drug prices often vary depending on where the prescription is filled and some pharmacies may offer cheaper prices.  The website www.goodrx.com contains coupons for medications through different pharmacies. The prices here do not account for what the cost may be with help from insurance (it may be cheaper with your insurance), but the website can give you the price if you did not use any insurance.  - You can print the associated coupon and take it with your prescription to the pharmacy.  - You may also stop by our office during regular business hours and pick up a GoodRx coupon card.  - If you need your prescription sent electronically to a different pharmacy, notify our office through [facility] or by phone at [phone]    Skin Education :   I counseled the  patient regarding the following: Sun screen (SPF 30 or greater) should be applied during peak UV exposure (between 10am and 2pm) and reapplied after exercise or swimming.  The ABCDEs of melanoma were reviewed with the patient, and the importance of monthly self-examination of moles was emphasized. Should any moles change in shape or color, or itch, bleed or burn, pt will contact our office for evaluation sooner then their interval appointment.  Plan: Sunscreen Recommendations I recommended a broad spectrum sunscreen with a SPF of 30 or higher. I explained that SPF 30 sunscreens block approximately 97 percent of the sun's harmful rays. Sunscreens should be applied at least 15 minutes prior to expected sun exposure and then every 2 hours after that as long as sun exposure continues. If swimming or exercising sunscreen should be reapplied every 45 minutes to an hour after getting wet or sweating. One ounce, or the equivalent of a shot glass full of sunscreen, is adequate to protect the skin not covered by a bathing suit. I also recommended a lip balm with a sunscreen as well. Sun protective clothing can be used in lieu of sunscreen but must be  worn the entire time you are exposed to the sun's rays.

## 2023-03-01 DIAGNOSIS — F4312 Post-traumatic stress disorder, chronic: Secondary | ICD-10-CM | POA: Diagnosis not present

## 2023-03-01 DIAGNOSIS — M67432 Ganglion, left wrist: Secondary | ICD-10-CM | POA: Diagnosis not present

## 2023-03-07 DIAGNOSIS — M4722 Other spondylosis with radiculopathy, cervical region: Secondary | ICD-10-CM | POA: Diagnosis not present

## 2023-03-08 ENCOUNTER — Other Ambulatory Visit: Payer: Self-pay | Admitting: Internal Medicine

## 2023-03-08 DIAGNOSIS — M4722 Other spondylosis with radiculopathy, cervical region: Secondary | ICD-10-CM

## 2023-03-08 DIAGNOSIS — H4711 Papilledema associated with increased intracranial pressure: Secondary | ICD-10-CM | POA: Diagnosis not present

## 2023-03-15 ENCOUNTER — Encounter: Payer: Self-pay | Admitting: Internal Medicine

## 2023-03-16 ENCOUNTER — Ambulatory Visit
Admission: RE | Admit: 2023-03-16 | Discharge: 2023-03-16 | Disposition: A | Discharge status: Home / Self Care | Payer: BC Managed Care – PPO | Source: Ambulatory Visit | Attending: Internal Medicine | Admitting: Internal Medicine

## 2023-03-16 DIAGNOSIS — M50223 Other cervical disc displacement at C6-C7 level: Secondary | ICD-10-CM | POA: Diagnosis not present

## 2023-03-16 DIAGNOSIS — M47812 Spondylosis without myelopathy or radiculopathy, cervical region: Secondary | ICD-10-CM | POA: Diagnosis not present

## 2023-03-16 DIAGNOSIS — M4722 Other spondylosis with radiculopathy, cervical region: Secondary | ICD-10-CM

## 2023-03-16 DIAGNOSIS — M50222 Other cervical disc displacement at C5-C6 level: Secondary | ICD-10-CM | POA: Diagnosis not present

## 2023-04-04 DIAGNOSIS — M4722 Other spondylosis with radiculopathy, cervical region: Secondary | ICD-10-CM | POA: Diagnosis not present

## 2023-04-17 DIAGNOSIS — F4312 Post-traumatic stress disorder, chronic: Secondary | ICD-10-CM | POA: Diagnosis not present

## 2023-04-19 ENCOUNTER — Ambulatory Visit: Payer: BC Managed Care – PPO | Admitting: Nurse Practitioner

## 2023-05-01 ENCOUNTER — Ambulatory Visit (INDEPENDENT_AMBULATORY_CARE_PROVIDER_SITE_OTHER): Payer: BC Managed Care – PPO | Admitting: Family Medicine

## 2023-05-01 ENCOUNTER — Encounter: Payer: Self-pay | Admitting: Family Medicine

## 2023-05-01 VITALS — BP 129/80 | HR 73 | Resp 18 | Ht 64.0 in | Wt 154.0 lb

## 2023-05-01 DIAGNOSIS — F411 Generalized anxiety disorder: Secondary | ICD-10-CM

## 2023-05-01 DIAGNOSIS — L089 Local infection of the skin and subcutaneous tissue, unspecified: Secondary | ICD-10-CM | POA: Diagnosis not present

## 2023-05-01 DIAGNOSIS — F909 Attention-deficit hyperactivity disorder, unspecified type: Secondary | ICD-10-CM | POA: Diagnosis not present

## 2023-05-01 DIAGNOSIS — Z975 Presence of (intrauterine) contraceptive device: Secondary | ICD-10-CM

## 2023-05-01 MED ORDER — BUSPIRONE HCL 10 MG PO TABS
10.0000 mg | ORAL_TABLET | Freq: Two times a day (BID) | ORAL | 2 refills | Status: DC | PRN
Start: 2023-05-01 — End: 2023-10-10

## 2023-05-01 MED ORDER — BUPROPION HCL ER (SR) 150 MG PO TB12
150.0000 mg | ORAL_TABLET | Freq: Every day | ORAL | 2 refills | Status: DC
Start: 2023-05-01 — End: 2023-05-30

## 2023-05-01 MED ORDER — SULFAMETHOXAZOLE-TRIMETHOPRIM 800-160 MG PO TABS
1.0000 | ORAL_TABLET | Freq: Two times a day (BID) | ORAL | 0 refills | Status: AC
Start: 2023-05-01 — End: 2023-05-06

## 2023-05-01 NOTE — Progress Notes (Signed)
Established Patient Office Visit  Subjective   Patient ID: Ashley Perez, female    DOB: 08-22-1991  Age: 32 y.o. MRN: [id number]  Chief Complaint  Patient presents with   Anxiety   Depression    HPI TAMYRA FOJTIK is a 32 y.o. female presenting today for follow up of mood. She also needs a referral to a new OBGYN, it is almost time to remove her IUD. She has a history of HSV2 and folliculitis, she has two slightly itchy, red spots around her bikini line that will not go away. Mood: Patient is here to follow up for depression and anxiety, currently managing with buspirone 10 mg BID. Taking medication without side effects, reports excellent compliance with treatment. Denies mood changes or SI/HI. She feels mood is  slightly better  since last visit. She continues to work with a Veterinary surgeon who diagnosed her with ADHD. She is hesitant to start any medication with potential for abuse, but she is open to discussing other options.     05/01/2023    8:32 AM 12/28/2022    8:42 AM 09/08/2015   11:32 AM  Depression screen PHQ 2/9  Decreased Interest 0 0 0  Down, Depressed, Hopeless 1 1 0  PHQ - 2 Score 1 1 0  Altered sleeping 2 1   Tired, decreased energy 1 0   Change in appetite 2 1   Feeling bad or failure about yourself  1 0   Trouble concentrating 0 0   Moving slowly or fidgety/restless 0 0   Suicidal thoughts 1 0   PHQ-9 Score 8 3   Difficult doing work/chores Somewhat difficult         05/01/2023    8:33 AM 12/28/2022    8:42 AM  GAD 7 : Generalized Anxiety Score  Nervous, Anxious, on Edge 1 0  Control/stop worrying 1 0  Worry too much - different things 1 1  Trouble relaxing 0 0  Restless 0 0  Easily annoyed or irritable 1 1  Afraid - awful might happen 1 0  Total GAD 7 Score 5 2  Anxiety Difficulty Not difficult at all     ROS Negative unless otherwise noted in HPI   Objective:     BP 129/80 (BP Location: Left Arm, Patient Position: Sitting, Cuff Size: Normal)   Pulse  73   Resp 18   Ht 5\' 4"  (1.626 m)   Wt 154 lb (69.9 kg)   SpO2 100%   BMI 26.43 kg/m   Physical Exam Constitutional:      General: She is not in acute distress.    Appearance: Normal appearance.  HENT:     Head: Normocephalic and atraumatic.  Pulmonary:     Effort: Pulmonary effort is normal. No respiratory distress.  Musculoskeletal:     Cervical back: Normal range of motion.  Neurological:     General: No focal deficit present.     Mental Status: She is alert and oriented to person, place, and time. Mental status is at baseline.  Psychiatric:        Mood and Affect: Mood normal.        Thought Content: Thought content normal.        Judgment: Judgment normal.      Assessment & Plan:  Generalized anxiety disorder Assessment & Plan: PHQ-9 score 8, GAD-7 score 5. Continue buspirone 10 mg BID, start Wellbutrin 150 mg daily. Will continue to monitor.  Orders: -  buPROPion HCl ER (SR); Take 1 tablet (150 mg total) by mouth daily.  Dispense: 30 tablet; Refill: 2 -     busPIRone HCl; Take 1 tablet (10 mg total) by mouth 2 (two) times daily as needed.  Dispense: 60 tablet; Refill: 2  Attention deficit hyperactivity disorder (ADHD), unspecified ADHD type Assessment & Plan: Discussed nonstimulant options including Wellbutrin, clonidine, and guanfacine. Patient open to trial of Wellbutrin. Reviewed mechanism of action, common side effects. Start Wellbutrin 150 mg daily in the morning. If well tolerated, may increase to twice daily. We may also consider 24 hour tablet in the future. Patient verbalized understanding and is agreeable to this plan.  Orders: -     buPROPion HCl ER (SR); Take 1 tablet (150 mg total) by mouth daily.  Dispense: 30 tablet; Refill: 2  Skin infection -     Sulfamethoxazole-Trimethoprim; Take 1 tablet by mouth 2 (two) times daily for 5 days.  Dispense: 10 tablet; Refill: 0  IUD (intrauterine device) in place -     Ambulatory referral to Obstetrics /  Gynecology  Start Bactrim for folliculitis/possible abscess. If ineffective, consider trial of antiviral if still not seen at [facility].  Return in about 4 weeks (around 05/29/2023) for follow-up for mood and ADHD, started Wellbutrin.    Melida Quitter, PA

## 2023-05-01 NOTE — Assessment & Plan Note (Signed)
Discussed nonstimulant options including Wellbutrin, clonidine, and guanfacine. Patient open to trial of Wellbutrin. Reviewed mechanism of action, common side effects. Start Wellbutrin 150 mg daily in the morning. If well tolerated, may increase to twice daily. We may also consider 24 hour tablet in the future. Patient verbalized understanding and is agreeable to this plan.

## 2023-05-01 NOTE — Patient Instructions (Signed)
Finish the course of antibiotics, then start the Wellbutrin!

## 2023-05-01 NOTE — Assessment & Plan Note (Signed)
PHQ-9 score 8, GAD-7 score 5. Continue buspirone 10 mg BID, start Wellbutrin 150 mg daily. Will continue to monitor.

## 2023-05-30 ENCOUNTER — Ambulatory Visit: Payer: BC Managed Care – PPO | Admitting: Family Medicine

## 2023-05-30 ENCOUNTER — Ambulatory Visit (INDEPENDENT_AMBULATORY_CARE_PROVIDER_SITE_OTHER): Payer: BC Managed Care – PPO | Admitting: Family Medicine

## 2023-05-30 ENCOUNTER — Encounter: Payer: Self-pay | Admitting: Family Medicine

## 2023-05-30 VITALS — BP 120/77 | HR 74 | Resp 18 | Ht 64.0 in | Wt 149.0 lb

## 2023-05-30 DIAGNOSIS — R61 Generalized hyperhidrosis: Secondary | ICD-10-CM

## 2023-05-30 DIAGNOSIS — F909 Attention-deficit hyperactivity disorder, unspecified type: Secondary | ICD-10-CM | POA: Diagnosis not present

## 2023-05-30 NOTE — Progress Notes (Signed)
   Established Patient Office Visit  Subjective   Patient ID: Ashley Perez, female    DOB: [birth year]  Age: 32 y.o. MRN: [id number]  Chief Complaint  Patient presents with   ADHD   Excessive Sweating    HPI Ashley Perez is a 32 y.o. female presenting today for follow up of ADHD. She also would like to discuss potential options for excessive sweating, which has been an issue since she was a teenager. She heard from a friend that oxybutynin might be an option, but she would prefer options other than systemic medication if possible. ADHD: Trial of Wellbutrin was unsuccessful.  She found that she developed brain fog that was extensive enough that her friends, family, and coworkers noticed it.  She discontinued Wellbutrin.  For now, she would like to wait on any other medications for ADHD.  She has found ways throughout her life to work around it and is okay continuing to do so at this time.  ROS Negative unless otherwise noted in HPI   Objective:     BP 120/77 (BP Location: Left Arm, Patient Position: Sitting, Cuff Size: Normal)   Pulse 74   Resp 18   Ht 5\' 4"  (1.626 m)   Wt 149 lb (67.6 kg)   SpO2 99%   BMI 25.58 kg/m   Physical Exam Constitutional:      General: She is not in acute distress.    Appearance: Normal appearance.  HENT:     Head: Normocephalic and atraumatic.  Pulmonary:     Effort: Pulmonary effort is normal. No respiratory distress.  Musculoskeletal:     Cervical back: Normal range of motion.  Neurological:     General: No focal deficit present.     Mental Status: She is alert and oriented to person, place, and time. Mental status is at baseline.  Psychiatric:        Mood and Affect: Mood normal.        Thought Content: Thought content normal.        Judgment: Judgment normal.      Assessment & Plan:  Attention deficit hyperactivity disorder (ADHD), unspecified ADHD type Assessment & Plan: Wellbutrin caused brain fog.  In the future, may consider other  nonstimulant options like clonidine or guanfacine, but until that time patient would like to manage without medication.   Hyperhidrosis Assessment & Plan: Patient is already established with Dr. Onalee Hua at [facility] dermatology.  We discussed that as an established patient, she can call to schedule an appointment to discuss management options for hyperhidrosis.  Patient verbalized understanding and is agreeable to this plan.     Return in about 4 months (around 09/29/2023) for annual physical, fasting blood work 1 week before.    Melida Quitter, PA

## 2023-05-30 NOTE — Assessment & Plan Note (Signed)
Wellbutrin caused brain fog.  In the future, may consider other nonstimulant options like clonidine or guanfacine, but until that time patient would like to manage without medication.

## 2023-05-30 NOTE — Patient Instructions (Signed)
You can take the buspirone 2-3 times each day.  Call your dermatologist to schedule an appointment to discuss possible injections!

## 2023-05-30 NOTE — Assessment & Plan Note (Signed)
Patient is already established with Dr. Onalee Hua at [facility] dermatology.  We discussed that as an established patient, she can call to schedule an appointment to discuss management options for hyperhidrosis.  Patient verbalized understanding and is agreeable to this plan.

## 2023-06-09 DIAGNOSIS — M67432 Ganglion, left wrist: Secondary | ICD-10-CM | POA: Diagnosis not present

## 2023-06-09 DIAGNOSIS — M25532 Pain in left wrist: Secondary | ICD-10-CM | POA: Diagnosis not present

## 2023-06-09 DIAGNOSIS — G8929 Other chronic pain: Secondary | ICD-10-CM | POA: Diagnosis not present

## 2023-06-26 ENCOUNTER — Encounter: Payer: Self-pay | Admitting: Family Medicine

## 2023-06-30 DIAGNOSIS — M25632 Stiffness of left wrist, not elsewhere classified: Secondary | ICD-10-CM | POA: Diagnosis not present

## 2023-07-11 DIAGNOSIS — M25632 Stiffness of left wrist, not elsewhere classified: Secondary | ICD-10-CM | POA: Diagnosis not present

## 2023-07-18 DIAGNOSIS — M25632 Stiffness of left wrist, not elsewhere classified: Secondary | ICD-10-CM | POA: Diagnosis not present

## 2023-07-31 DIAGNOSIS — M79642 Pain in left hand: Secondary | ICD-10-CM | POA: Diagnosis not present

## 2023-07-31 DIAGNOSIS — M25632 Stiffness of left wrist, not elsewhere classified: Secondary | ICD-10-CM | POA: Diagnosis not present

## 2023-08-08 ENCOUNTER — Ambulatory Visit: Payer: BC Managed Care – PPO | Admitting: Dermatology

## 2023-08-15 ENCOUNTER — Ambulatory Visit: Payer: BC Managed Care – PPO | Admitting: Dermatology

## 2023-08-15 ENCOUNTER — Encounter: Payer: Self-pay | Admitting: Dermatology

## 2023-08-15 DIAGNOSIS — R61 Generalized hyperhidrosis: Secondary | ICD-10-CM | POA: Diagnosis not present

## 2023-08-15 DIAGNOSIS — L750 Bromhidrosis: Secondary | ICD-10-CM

## 2023-08-15 DIAGNOSIS — L74510 Primary focal hyperhidrosis, axilla: Secondary | ICD-10-CM

## 2023-08-15 MED ORDER — QBREXZA 2.4 % EX PADS
1.0000 | MEDICATED_PAD | CUTANEOUS | 2 refills | Status: AC
Start: 2023-08-15 — End: ?

## 2023-08-15 NOTE — Patient Instructions (Addendum)
Hello Tametria,  Thank you for visiting my office today. Your dedication to managing your hyperhidrosis is commendable, and I'm pleased we could discuss further treatment options.  Here is a summary of the key instructions from today's consultation:  - Qbrexza Wipes: Begin using Qbrexza (glycopyrronium) wipes.   - Application: Cut one wipe in half and use every other night.   - Prescription: Your prescription has been sent to [facility] pharmacy.  - Odor Management: Use a 4% benzoyl peroxide wash daily.   - Note: Ensure it is the wash form to avoid staining clothes.  - Side Effects: Be aware of potential side effects from Qbrexza wipes, such as dry mouth and urinary retention.  - Follow-Up: We have scheduled a follow-up appointment in 3 months to evaluate your progress.  Thank you once again for your visit today. I look forward to our next meeting in three months to assess the effectiveness of the new treatment.  Best regards,  Dr. [facility] Re[facility]        Important Information  Due to recent changes in healthcare laws, you may see results of your pathology and/or laboratory studies on MyChart before the doctors have had a chance to review them. We understand that in some cases there may be results that are confusing or concerning to you. Please understand that not all results are received at the same time and often the doctors may need to interpret multiple results in order to provide you with the best plan of care or course of treatment. Therefore, we ask that you please give [place] 2 business days to thoroughly review all your results before contacting the office for clarification. Should we see a critical lab result, you will be contacted sooner.   If You Need Anything After Your Visit  If you have any questions or concerns for your doctor, please call our main line at [phone] If no one answers, please leave a voicemail as directed and we will return your call as soon as  possible. Messages left after 4 pm will be answered the following business day.   You may also send [place] a message via MyChart. We typically respond to MyChart messages within 1-2 business days.  For prescription refills, please ask your pharmacy to contact our office. Our fax number is [phone].  If you have an urgent issue when the clinic is closed that cannot wait until the next business day, you can page your doctor at the number below.    Please note that while we do our best to be available for urgent issues outside of office hours, we are not available 24/7.   If you have an urgent issue and are unable to reach [place], you may choose to seek medical care at your doctor's office, retail clinic, urgent care center, or emergency room.  If you have a medical emergency, please immediately call 911 or go to the emergency department. In the event of inclement weather, please call our main line at [phone] for an update on the status of any delays or closures.  Dermatology Medication Tips: Please keep the boxes that topical medications come in in order to help keep track of the instructions about where and how to use these. Pharmacies typically print the medication instructions only on the boxes and not directly on the medication tubes.   If your medication is too expensive, please contact our office at [phone] or send [place] a message through MyChart.   We are unable to tell what  your co-pay for medications will be in advance as this is different depending on your insurance coverage. However, we may be able to find a substitute medication at lower cost or fill out paperwork to get insurance to cover a needed medication.   If a prior authorization is required to get your medication covered by your insurance company, please allow [place] 1-2 business days to complete this process.  Drug prices often vary depending on where the prescription is filled and some pharmacies may offer cheaper  prices.  The website www.goodrx.com contains coupons for medications through different pharmacies. The prices here do not account for what the cost may be with help from insurance (it may be cheaper with your insurance), but the website can give you the price if you did not use any insurance.  - You can print the associated coupon and take it with your prescription to the pharmacy.  - You may also stop by our office during regular business hours and pick up a GoodRx coupon card.  - If you need your prescription sent electronically to a different pharmacy, notify our office through [facility] or by phone at [phone]

## 2023-08-15 NOTE — Progress Notes (Signed)
   Follow-Up Visit   Subjective  Ashley Perez is a 32 y.o. female who presents for the following: excessive underarm sweating  Patient present today for follow up visit for excessive underarm sweating that has been ongoing since puberty. No sweating any where else. She has noticed in more over the last 2 years as she is now sedentary working in an office setting. She stated that she has tried a variety of different deodorants geared towards men and women, however, she have not noticed a difference. At times she notices an odor and mentioned that she changes her shirt 2-3 times a day due to sweat rings. Patient was last evaluated on 02/28/23 for TBSE. Patient denies medication changes.  The following portions of the chart were reviewed this encounter and updated as appropriate: medications, allergies, medical history  Review of Systems:  No other skin or systemic complaints except as noted in HPI or Assessment and Plan.  Objective  Well appearing patient in no apparent distress; mood and affect are within normal limits.    A focused examination was performed of the following areas: bilateral axilla   Relevant exam findings are noted in the Assessment and Plan.    Assessment & Plan   1. Hyperhidrosis - Assessment: Patient has tried over-the-counter and prescription-strength aluminum chloride without success. - Plan: Prescribe Qbrexza (glycopyrronium) wipes for topical treatment. Instruct the patient to cut one wipe in half and use it every other night. Educate the patient about potential side effects, including dry mouth and urinary retention. Send the prescription to [facility] pharmacy with the diagnosis and previous treatments noted. Schedule a follow-up appointment in 3 months to assess progress..  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 - Plan: Recommend daily use of a 4% benzoyl peroxide wash, such as CeraVe. Instruct the patient to ensure it is the wash,  not the cream, to avoid staining clothes.   Hyperhidrosis of axilla  Related Medications Glycopyrronium Tosylate (QBREXZA) 2.4 % PADS Apply 1 Application topically every other day. Use at bedtime.    Return in about 3 months (around 11/15/2023) for HYPERHIDROSIS.    Documentation: I have reviewed the above documentation for accuracy and completeness, and I agree with the above.   I, Shirron Marcha Solders, CMA, am acting as scribe for [employer], DO.   Langston Reusing, DO

## 2023-09-11 ENCOUNTER — Other Ambulatory Visit: Payer: Self-pay

## 2023-09-11 DIAGNOSIS — I10 Essential (primary) hypertension: Secondary | ICD-10-CM

## 2023-09-11 DIAGNOSIS — Z0000 Encounter for general adult medical examination without abnormal findings: Secondary | ICD-10-CM

## 2023-09-13 DIAGNOSIS — G932 Benign intracranial hypertension: Secondary | ICD-10-CM | POA: Diagnosis not present

## 2023-09-13 DIAGNOSIS — H47322 Drusen of optic disc, left eye: Secondary | ICD-10-CM | POA: Diagnosis not present

## 2023-09-22 ENCOUNTER — Other Ambulatory Visit: Payer: BC Managed Care – PPO

## 2023-09-22 DIAGNOSIS — Z0000 Encounter for general adult medical examination without abnormal findings: Secondary | ICD-10-CM | POA: Diagnosis not present

## 2023-09-22 DIAGNOSIS — I10 Essential (primary) hypertension: Secondary | ICD-10-CM | POA: Diagnosis not present

## 2023-09-23 LAB — CBC WITH DIFFERENTIAL/PLATELET
Basophils Absolute: 0 10*3/uL (ref 0.0–0.2)
Basos: 0 %
EOS (ABSOLUTE): 0.2 10*3/uL (ref 0.0–0.4)
Eos: 2 %
Hematocrit: 46.4 % (ref 34.0–46.6)
Hemoglobin: 15.5 g/dL (ref 11.1–15.9)
Immature Grans (Abs): 0 10*3/uL (ref 0.0–0.1)
Immature Granulocytes: 0 %
Lymphocytes Absolute: 3.5 10*3/uL — ABNORMAL HIGH (ref 0.7–3.1)
Lymphs: 37 %
MCH: 30.3 pg (ref 26.6–33.0)
MCHC: 33.4 g/dL (ref 31.5–35.7)
MCV: 91 fL (ref 79–97)
Monocytes Absolute: 0.5 10*3/uL (ref 0.1–0.9)
Monocytes: 6 %
Neutrophils Absolute: 5.2 10*3/uL (ref 1.4–7.0)
Neutrophils: 55 %
Platelets: 401 10*3/uL (ref 150–450)
RBC: 5.12 x10E6/uL (ref 3.77–5.28)
RDW: 12.5 % (ref 11.7–15.4)
WBC: 9.5 10*3/uL (ref 3.4–10.8)

## 2023-09-23 LAB — COMPREHENSIVE METABOLIC PANEL
ALT: 14 [IU]/L (ref 0–32)
AST: 13 [IU]/L (ref 0–40)
Albumin: 5.1 g/dL — ABNORMAL HIGH (ref 3.9–4.9)
Alkaline Phosphatase: 62 [IU]/L (ref 44–121)
BUN/Creatinine Ratio: 16 (ref 9–23)
BUN: 12 mg/dL (ref 6–20)
Bilirubin Total: 0.5 mg/dL (ref 0.0–1.2)
CO2: 22 mmol/L (ref 20–29)
Calcium: 9.8 mg/dL (ref 8.7–10.2)
Chloride: 103 mmol/L (ref 96–106)
Creatinine, Ser: 0.74 mg/dL (ref 0.57–1.00)
Globulin, Total: 2.4 g/dL (ref 1.5–4.5)
Glucose: 95 mg/dL (ref 70–99)
Potassium: 4.5 mmol/L (ref 3.5–5.2)
Sodium: 138 mmol/L (ref 134–144)
Total Protein: 7.5 g/dL (ref 6.0–8.5)
eGFR: 109 mL/min/{1.73_m2} (ref >59)

## 2023-09-23 LAB — TSH: TSH: 1.4 u[IU]/mL (ref 0.450–4.500)

## 2023-09-23 LAB — LIPID PANEL
Chol/HDL Ratio: 3.3 {ratio} (ref 0.0–4.4)
Cholesterol, Total: 183 mg/dL (ref 100–199)
HDL: 56 mg/dL (ref >39)
LDL Chol Calc (NIH): 109 mg/dL — ABNORMAL HIGH (ref 0–99)
Triglycerides: 99 mg/dL (ref 0–149)
VLDL Cholesterol Cal: 18 mg/dL (ref 5–40)

## 2023-09-23 LAB — HEMOGLOBIN A1C
Est. average glucose Bld gHb Est-mCnc: 111 mg/dL
Hgb A1c MFr Bld: 5.5 % (ref 4.8–5.6)

## 2023-09-29 ENCOUNTER — Other Ambulatory Visit (HOSPITAL_COMMUNITY)
Admission: RE | Admit: 2023-09-29 | Discharge: 2023-09-29 | Disposition: A | Discharge status: Home / Self Care | Payer: BC Managed Care – PPO | Source: Ambulatory Visit | Attending: Radiology | Admitting: Radiology

## 2023-09-29 ENCOUNTER — Encounter: Payer: Self-pay | Admitting: Radiology

## 2023-09-29 ENCOUNTER — Ambulatory Visit (INDEPENDENT_AMBULATORY_CARE_PROVIDER_SITE_OTHER): Payer: BC Managed Care – PPO | Admitting: Radiology

## 2023-09-29 ENCOUNTER — Encounter: Payer: BC Managed Care – PPO | Admitting: Family Medicine

## 2023-09-29 VITALS — BP 116/68 | HR 81 | Ht 62.5 in | Wt 138.0 lb

## 2023-09-29 DIAGNOSIS — L02214 Cutaneous abscess of groin: Secondary | ICD-10-CM

## 2023-09-29 DIAGNOSIS — Z1331 Encounter for screening for depression: Secondary | ICD-10-CM | POA: Diagnosis not present

## 2023-09-29 DIAGNOSIS — Z30433 Encounter for removal and reinsertion of intrauterine contraceptive device: Secondary | ICD-10-CM

## 2023-09-29 DIAGNOSIS — Z01419 Encounter for gynecological examination (general) (routine) without abnormal findings: Secondary | ICD-10-CM | POA: Insufficient documentation

## 2023-09-29 DIAGNOSIS — Z113 Encounter for screening for infections with a predominantly sexual mode of transmission: Secondary | ICD-10-CM | POA: Diagnosis not present

## 2023-09-29 MED ORDER — SULFAMETHOXAZOLE-TRIMETHOPRIM 800-160 MG PO TABS: 1.0000 | ORAL_TABLET | Freq: Two times a day (BID) | ORAL | 0 refills | Status: DC

## 2023-09-29 NOTE — Patient Instructions (Signed)

## 2023-09-29 NOTE — Progress Notes (Signed)
   ROSEBELLE EDGEWORTH [birth year] [id number]   History:  32 y.o. G1P0102 presents for annual exam as a new patient. Reports HPV+ pap last year. Hx of fibrocystic breasts. Mirena due to be exchanged next month. Would like STI screen with pap. +depression and anxiety, on buspar prn, managed by PCP. C/o Abscess of left groin, no improvement after antibiotics given by PCP last month.  Gynecologic History No LMP recorded. (Menstrual status: IUD).   Contraception/Family planning: IUD Sexually active: yes Last Pap: 2023. Results were: abnormal Last mammogram: 2023. Results were: normal  Obstetric History OB History  Gravida Para Term Preterm AB Living  1 1 0 1 0 2  SAB IAB Ectopic Multiple Live Births  0 0 0 1 2    # Outcome Date GA Lbr Len/2nd Weight Sex Type Anes PTL Lv  1A Preterm 09/11/15 [redacted]w[redacted]d  4 lb 2.7 oz (1.89 kg) M CS-LTranv Spinal  LIV  1B Preterm 09/11/15 [redacted]w[redacted]d  3 lb 13 oz (1.73 kg) F CS-LTranv Spinal  LIV    The following portions of the patient's history were reviewed and updated as appropriate: allergies, current medications, past family history, past medical history, past social history, past surgical history, and problem list.  Review of Systems  All other systems reviewed and are negative.   Past medical history, past surgical history, family history and social history were all reviewed and documented in the EPIC chart.  Exam:  Vitals:   09/29/23 0946  BP: 116/68  Pulse: 81  SpO2: 99%  Weight: 138 lb (62.6 kg)  Height: 5' 2.5" (1.588 m)   Body mass index is 24.84 kg/m.  Physical Exam   Raynelle Fanning, CMA present for exam  Assessment/Plan:   1. Well woman exam with routine gynecological exam (Primary) - Cytology - PAP( [place])  2. Screening for STDs (sexually transmitted diseases) - Cytology - PAP( [place])  3. Encounter for removal and reinsertion of intrauterine contraceptive device (IUD) - IUD Insertion; Future - IUD removal; Future  4. Positive  depression screening Therapy referral provided Declines meds at this time Follow up with PCP  5. Abscess of groin - sulfamethoxazole-trimethoprim (BACTRIM DS) 800-160 MG tablet; Take 1 tablet by mouth 2 (two) times daily.  Dispense: 20 tablet; Refill: 0   Discussed SBE, colonoscopy and DEXA screening as directed/appropriate. Recommend of exercise weekly, including weight bearing exercise. Encouraged the use of seatbelts and sunscreen.  Return for IUD replacement ASAP.  Arlie Solomons B WHNP-BC 10:29 AM 09/29/2023

## 2023-10-03 LAB — CYTOLOGY - PAP
Adequacy: ABSENT
Chlamydia: NEGATIVE
Comment: NEGATIVE
Comment: NEGATIVE
Comment: NEGATIVE
Comment: NORMAL
Diagnosis: NEGATIVE
High risk HPV: NEGATIVE
Neisseria Gonorrhea: NEGATIVE
Trichomonas: NEGATIVE

## 2023-10-06 ENCOUNTER — Other Ambulatory Visit: Payer: Self-pay | Admitting: Radiology

## 2023-10-06 DIAGNOSIS — B9689 Other specified bacterial agents as the cause of diseases classified elsewhere: Secondary | ICD-10-CM

## 2023-10-06 MED ORDER — METRONIDAZOLE 500 MG PO TABS: 500.0000 mg | ORAL_TABLET | Freq: Two times a day (BID) | ORAL | 0 refills | Status: DC

## 2023-10-10 ENCOUNTER — Ambulatory Visit (INDEPENDENT_AMBULATORY_CARE_PROVIDER_SITE_OTHER): Payer: BC Managed Care – PPO | Admitting: Radiology

## 2023-10-10 ENCOUNTER — Encounter: Payer: Self-pay | Admitting: Family Medicine

## 2023-10-10 ENCOUNTER — Encounter: Payer: Self-pay | Admitting: Radiology

## 2023-10-10 ENCOUNTER — Ambulatory Visit (INDEPENDENT_AMBULATORY_CARE_PROVIDER_SITE_OTHER): Payer: BC Managed Care – PPO | Admitting: Family Medicine

## 2023-10-10 VITALS — BP 118/78 | HR 88 | Temp 98.3°F | Ht 62.0 in | Wt 140.0 lb

## 2023-10-10 VITALS — BP 104/70 | HR 90

## 2023-10-10 DIAGNOSIS — F411 Generalized anxiety disorder: Secondary | ICD-10-CM

## 2023-10-10 DIAGNOSIS — G4700 Insomnia, unspecified: Secondary | ICD-10-CM | POA: Diagnosis not present

## 2023-10-10 DIAGNOSIS — F322 Major depressive disorder, single episode, severe without psychotic features: Secondary | ICD-10-CM | POA: Insufficient documentation

## 2023-10-10 DIAGNOSIS — Z30433 Encounter for removal and reinsertion of intrauterine contraceptive device: Secondary | ICD-10-CM

## 2023-10-10 DIAGNOSIS — Z0000 Encounter for general adult medical examination without abnormal findings: Secondary | ICD-10-CM | POA: Diagnosis not present

## 2023-10-10 DIAGNOSIS — Z01812 Encounter for preprocedural laboratory examination: Secondary | ICD-10-CM

## 2023-10-10 LAB — PREGNANCY, URINE: Preg Test, Ur: NEGATIVE

## 2023-10-10 MED ORDER — QUETIAPINE FUMARATE 25 MG PO TABS: 25.0000 mg | ORAL_TABLET | Freq: Every day | ORAL | 2 refills | Status: DC

## 2023-10-10 MED ORDER — LEVONORGESTREL 20 MCG/DAY IU IUD
1.0000 | INTRAUTERINE_SYSTEM | Freq: Once | INTRAUTERINE | Status: AC
  Administered 2023-10-10: 1 via INTRAUTERINE

## 2023-10-10 MED ORDER — BUSPIRONE HCL 10 MG PO TABS: 10.0000 mg | ORAL_TABLET | Freq: Two times a day (BID) | ORAL | 2 refills | Status: DC | PRN

## 2023-10-10 NOTE — Patient Instructions (Addendum)
 The option that I found to help with your sleep that should not cause as many interactions with topiramate has some of the other sleep medicines is called quetiapine .  It has a variety of uses, but at its lowest doses it can be used to help fall asleep and stay asleep.  We will start at the absolute lowest dose and see how it goes.

## 2023-10-10 NOTE — Progress Notes (Signed)
 Complete physical exam  Patient: Ashley Perez   DOB: 10-12-1990   32 y.o. Female  MRN: [id number]  Subjective:    Chief Complaint  Patient presents with   Follow-up    Ashley Perez is a 32 y.o. female who presents today for a complete physical exam. She reports consuming a general diet. She generally feels poorly due to feeling tired all the time but being unable to sleep. She reports sleeping poorly ever since her dad died a couple of months ago.  She wakes up about every 2 hours throughout the night.  She is sometimes able to get back to sleep but not all the time, and she finds that she cannot take naps during the day even when she is exhausted.  Her biggest concern is making sure that any medication that she takes to help her sleep does not cause adverse effects with topiramate.   Most recent fall risk assessment:    10/10/2023   10:35 AM  Fall Risk   Falls in the past year? 0  Risk for fall due to : No Fall Risks  Follow up Falls evaluation completed     Most recent depression and anxiety screenings:    10/10/2023   10:35 AM 09/29/2023    9:51 AM  PHQ 2/9 Scores  PHQ - 2 Score 2 4  PHQ- 9 Score 11 18      10/10/2023   10:36 AM 05/30/2023    9:42 AM 05/01/2023    8:33 AM 12/28/2022    8:42 AM  GAD 7 : Generalized Anxiety Score  Nervous, Anxious, on Edge 3 2 1  0  Control/stop worrying 3 2 1  0  Worry too much - different things 3 2 1 1   Trouble relaxing 1 1 0 0  Restless 0 1 0 0  Easily annoyed or irritable 1 2 1 1   Afraid - awful might happen 0 2 1 0  Total GAD 7 Score 11 12 5 2   Anxiety Difficulty Somewhat difficult Somewhat difficult Not difficult at all     Patient Active Problem List   Diagnosis Date Noted   Moderately severe major depression (HCC) 10/10/2023   Insomnia 10/10/2023   Hyperhidrosis 05/30/2023   Attention deficit hyperactivity disorder (ADHD) 05/01/2023   IUD (intrauterine device) in place 05/01/2023   BMI 27.0-27.9,adult 02/21/2023    Ganglion cyst of dorsum of left wrist 02/05/2023   Generalized anxiety disorder 02/05/2023   Neoplasm of uncertain behavior of skin of trunk 02/05/2023   Herpes simplex 09/11/2015   Smoker 09/11/2015    Past Surgical History:  Procedure Laterality Date   ADENOIDECTOMY     CESAREAN SECTION N/A 09/11/2015   Procedure: CESAREAN SECTION;  Surgeon: Jon Rummer, MD;  Location: [facility];  Service: Obstetrics;  Laterality: N/A;   removal of  benign moles     TONSILLECTOMY AND ADENOIDECTOMY     wrist procedure     05/2023   Social History   Tobacco Use   Smoking status: Every Day    Current packs/day: 0.25    Average packs/day: 0.3 packs/day for 10.0 years (2.5 ttl pk-yrs)    Types: Cigarettes, E-cigarettes    Passive exposure: Past   Smokeless tobacco: Never   Tobacco comments:    Vapes, quit smoking cigarettes  Vaping Use   Vaping status: Every Day  Substance Use Topics   Alcohol use: Not Currently    Comment: quit ETOH 9 months ago   Drug use: Yes  Types: Marijuana   Family History  Problem Relation Age of Onset   Heart disease Father    Kidney disease Father    Melanoma Maternal Grandmother    Glaucoma Maternal Grandmother    Breast cancer Paternal Grandmother    Diabetes Paternal Grandfather    Allergies  Allergen Reactions   Banana Itching     Patient Care Team: Wallace Joesph LABOR, PA as PCP - General (Family Medicine) Chrzanowski, Jami B, NP as Nurse Practitioner (Obstetrics and Gynecology)   Outpatient Medications Prior to Visit  Medication Sig   Glycopyrronium Tosylate  (QBREXZA ) 2.4 % PADS Apply 1 Application topically every other day. Use at bedtime.   metroNIDAZOLE  (FLAGYL ) 500 MG tablet Take 1 tablet (500 mg total) by mouth 2 (two) times daily.   Multiple Vitamin (MULTIVITAMIN) capsule Take 1 capsule by mouth daily.   sulfamethoxazole -trimethoprim  (BACTRIM  DS) 800-160 MG tablet Take 1 tablet by mouth 2 (two) times daily.   topiramate (TOPAMAX) 50 MG  tablet Take 50 mg by mouth daily.   [DISCONTINUED] busPIRone  (BUSPAR ) 10 MG tablet Take 1 tablet (10 mg total) by mouth 2 (two) times daily as needed.   [DISCONTINUED] topiramate (TOPAMAX) 25 MG tablet Take 25 mg by mouth at bedtime.   No facility-administered medications prior to visit.    Review of Systems  Constitutional:  Negative for chills, fever and malaise/fatigue.  HENT:  Negative for congestion and hearing loss.   Eyes:  Negative for blurred vision and double vision.  Respiratory:  Negative for cough and shortness of breath.   Cardiovascular:  Negative for chest pain, palpitations and leg swelling.  Gastrointestinal:  Negative for abdominal pain, constipation, diarrhea and heartburn.  Genitourinary:  Negative for frequency and urgency.  Musculoskeletal:  Negative for myalgias and neck pain.  Neurological:  Negative for headaches.  Endo/Heme/Allergies:  Negative for polydipsia.  Psychiatric/Behavioral:  Positive for depression. The patient is nervous/anxious and has insomnia.       Objective:    BP 118/78   Pulse 88   Temp 98.3 F (36.8 C) (Temporal)   Ht 5' 2 (1.575 m)   Wt 140 lb (63.5 kg)   SpO2 100%   BMI 25.61 kg/m    Physical Exam Constitutional:      General: She is not in acute distress.    Appearance: Normal appearance.  HENT:     Head: Normocephalic and atraumatic.     Right Ear: Tympanic membrane, ear canal and external ear normal. There is no impacted cerumen.     Left Ear: Tympanic membrane, ear canal and external ear normal. There is no impacted cerumen.     Nose: Nose normal.     Mouth/Throat:     Mouth: Mucous membranes are moist.     Pharynx: No oropharyngeal exudate or posterior oropharyngeal erythema.  Eyes:     Extraocular Movements: Extraocular movements intact.     Conjunctiva/sclera: Conjunctivae normal.     Pupils: Pupils are equal, round, and reactive to light.     Comments: Wearing glasses, up-to-date  Neck:     Thyroid: No  thyroid mass, thyromegaly or thyroid tenderness.  Cardiovascular:     Rate and Rhythm: Normal rate and regular rhythm.     Heart sounds: Normal heart sounds. No murmur heard.    No friction rub. No gallop.  Pulmonary:     Effort: Pulmonary effort is normal. No respiratory distress.     Breath sounds: Normal breath sounds. No wheezing, rhonchi or rales.  Abdominal:     General: Abdomen is flat. Bowel sounds are normal. There is no distension.     Palpations: There is no mass.     Tenderness: There is no abdominal tenderness. There is no guarding.  Musculoskeletal:        General: Normal range of motion.     Cervical back: Normal range of motion and neck supple.  Lymphadenopathy:     Cervical: No cervical adenopathy.  Skin:    General: Skin is warm and dry.     Comments: Hirsutism of chin  Neurological:     Mental Status: She is alert and oriented to person, place, and time.     Cranial Nerves: No cranial nerve deficit.     Motor: No weakness.     Deep Tendon Reflexes: Reflexes normal.  Psychiatric:        Mood and Affect: Mood normal.        Assessment & Plan:    Routine Health Maintenance and Physical Exam  Immunization History  Administered Date(s) Administered   Influenza, Seasonal, Injecte, Preservative Fre 07/06/2015   MMR 09/14/2015   Pneumococcal Polysaccharide-23 09/14/2015   Tdap 07/16/2015    Health Maintenance  Topic Date Due   Hepatitis C Screening  Never done   COVID-19 Vaccine (1) 10/26/2023 (Originally 03/05/1994)   INFLUENZA VACCINE  01/08/2024 (Originally 05/11/2023)   DTaP/Tdap/Td (2 - Td or Tdap) 07/15/2025   Cervical Cancer Screening (HPV/Pap Cotest)  09/28/2028   HIV Screening  Completed   HPV VACCINES  Aged Out    Reviewed most recent labs including CBC, CMP, lipid panel, A1C, TSH, and vitamin D. All within normal limits/stable from last check other than LDL slightly elevated 109.  Discussed health benefits of physical activity, and encouraged  her to engage in regular exercise appropriate for her age and condition.  Wellness examination  Generalized anxiety disorder Assessment & Plan: Continue buspirone  10 mg twice daily.  Will continue to monitor.  Orders: -     busPIRone  HCl; Take 1 tablet (10 mg total) by mouth 2 (two) times daily as needed.  Dispense: 60 tablet; Refill: 2  Insomnia, unspecified type Assessment & Plan: Due to prescription for topiramate, making efforts to avoid medications that act as CNS depressants.  Initiate trial of low-dose quetiapine  25 mg daily at bedtime.  Generally, with low doses of topiramate and quetiapine  there is low risk for adverse reactions.  Will monitor closely.  Orders: -     QUEtiapine  Fumarate; Take 1 tablet (25 mg total) by mouth at bedtime.  Dispense: 30 tablet; Refill: 2    Return in about 4 weeks (around 11/07/2023) for follow-up for insomnia.     Joesph DELENA Sear, PA

## 2023-10-10 NOTE — Assessment & Plan Note (Signed)
Continue buspirone 10 mg twice daily.  Will continue to monitor.

## 2023-10-10 NOTE — Progress Notes (Signed)
   Ashley Perez 03/29/1991 [id number]   History:  32 y.o. G1P2 presents for removal and reinsertion of Mirena  IUD.  Pt has been counseled about risks and benefits as well as complications.  Consent is obtained today.  No LMP recorded. (Menstrual status: IUD). GC/CT/Trich testing: obtained  Past medical history, past surgical history, family history and social history were all reviewed and documented in the EPIC chart.  ROS:  A ROS was performed and pertinent positives and negatives are included.  Exam: There were no vitals filed for this visit. There is no height or weight on file to calculate BMI.  Pelvic exam: Vulva:  normal female genitalia Vagina:  normal vagina, no discharge, exudate, lesion, or erythema Cervix:  Non-tender, Negative CMT, no lesions or redness. Uterus:  normal shape, position and consistency    Procedure:  Speculum inserted.   Cervix visualized and cleansed with Betadine x 3.  Tenaculum placed on anterior cervix. IUD removed without difficulty, then uterus sounded to 3 cm. IUD inserted easily. Strings trimmed to 9 cm.  Minimal bleeding noted.  Pt tolerated the procedure well.  Chaperone present: Ashley Perez, CMA   Assessment/Plan: 1. Encounter for removal and reinsertion of intrauterine contraceptive device (IUD) (Primary) - IUD Insertion - IUD removal - levonorgestrel  (MIRENA ) 20 MCG/DAY IUD 1 each  2. Encounter for preprocedural laboratory examination - Pregnancy, urine      Return for recheck 4-6 weeks Pt aware to call for any concerns Pt aware removal due no later than 10/10/2031, IUD card given to pt.   Shaqueena Mauceri B WHNP-BC, 8:16 AM 10/10/2023

## 2023-10-10 NOTE — Assessment & Plan Note (Signed)
 Due to prescription for topiramate, making efforts to avoid medications that act as CNS depressants.  Initiate trial of low-dose quetiapine  25 mg daily at bedtime.  Generally, with low doses of topiramate and quetiapine  there is low risk for adverse reactions.  Will monitor closely.

## 2023-11-02 ENCOUNTER — Other Ambulatory Visit: Payer: Self-pay | Admitting: Family Medicine

## 2023-11-02 DIAGNOSIS — G4700 Insomnia, unspecified: Secondary | ICD-10-CM

## 2023-11-03 NOTE — Telephone Encounter (Signed)
Patient scheduled appt for 6wk iud check & check of the lump for 12-01-23. Patient was called back & scheduling department left her a message for her to call us to get in sooner to recheck the lump. Patient was advised when she scheduled that appt for 12-01-23 to have it done sooner but declined because she wanted to see Jami. Message was left to see about her coming in sooner.

## 2023-11-03 NOTE — Telephone Encounter (Signed)
Please schedule OV.

## 2023-11-03 NOTE — Telephone Encounter (Signed)
I called patient to let her know & transferred her to the front desk to get scheduled for an appointment

## 2023-11-08 ENCOUNTER — Ambulatory Visit (INDEPENDENT_AMBULATORY_CARE_PROVIDER_SITE_OTHER): Payer: BC Managed Care – PPO | Admitting: Nurse Practitioner

## 2023-11-08 ENCOUNTER — Ambulatory Visit (INDEPENDENT_AMBULATORY_CARE_PROVIDER_SITE_OTHER): Payer: BC Managed Care – PPO | Admitting: Family Medicine

## 2023-11-08 ENCOUNTER — Encounter: Payer: Self-pay | Admitting: Family Medicine

## 2023-11-08 VITALS — BP 112/76 | HR 67 | Wt 141.0 lb

## 2023-11-08 DIAGNOSIS — L02214 Cutaneous abscess of groin: Secondary | ICD-10-CM

## 2023-11-08 DIAGNOSIS — L089 Local infection of the skin and subcutaneous tissue, unspecified: Secondary | ICD-10-CM | POA: Diagnosis not present

## 2023-11-08 DIAGNOSIS — G4700 Insomnia, unspecified: Secondary | ICD-10-CM

## 2023-11-08 DIAGNOSIS — F411 Generalized anxiety disorder: Secondary | ICD-10-CM

## 2023-11-08 MED ORDER — QUETIAPINE FUMARATE 25 MG PO TABS: 25.0000 mg | ORAL_TABLET | Freq: Every day | ORAL | 1 refills | Status: AC

## 2023-11-08 MED ORDER — BUSPIRONE HCL 10 MG PO TABS: 10.0000 mg | ORAL_TABLET | Freq: Two times a day (BID) | ORAL | 1 refills | Status: AC | PRN

## 2023-11-08 NOTE — Assessment & Plan Note (Signed)
Well-controlled.  Continue quetiapine 25 mg daily at bedtime. Due to prescription for topiramate, making efforts to avoid medications that act as CNS depressants. Generally, with low doses of topiramate and quetiapine there is low risk for adverse reactions.  Will monitor closely.

## 2023-11-08 NOTE — Patient Instructions (Signed)
I am so excited for you!!!

## 2023-11-08 NOTE — Progress Notes (Signed)
Established Patient Office Visit  Subjective   Patient ID: Ashley Perez, female    DOB: September 10, 1991  Age: 33 y.o. MRN: [id number]  Chief Complaint  Patient presents with   Follow-up    HPI Ashley Perez is a 33 y.o. female presenting today for follow up of insomnia.  At last appointment, initiated trial of low-dose quetiapine 25 mg daily at bedtime, struggling with sleep maintenance ever since her dad died a couple of months ago.  We discussed that since she is also taking topiramate, we will need to keep that dose of quetiapine low as they are both CNS depressants.  Her sleep has significantly improved since starting the quetiapine.  She still finds that she wakes up a couple of times at night to use the bathroom, but she is able to go right back to sleep instead of laying awake for long periods of time.  She is now trying to find what time in the evening she needs to stop her fluid intake to decrease the number of times that she needs to get up at night to use the bathroom.  Otherwise, she is very happy with her sleep.  Outpatient Medications Prior to Visit  Medication Sig   Glycopyrronium Tosylate (QBREXZA) 2.4 % PADS Apply 1 Application topically every other day. Use at bedtime.   Multiple Vitamin (MULTIVITAMIN) capsule Take 1 capsule by mouth daily.   topiramate (TOPAMAX) 50 MG tablet Take 50 mg by mouth daily.   [DISCONTINUED] busPIRone (BUSPAR) 10 MG tablet Take 1 tablet (10 mg total) by mouth 2 (two) times daily as needed.   [DISCONTINUED] QUEtiapine (SEROQUEL) 25 MG tablet Take 1 tablet (25 mg total) by mouth at bedtime.   [DISCONTINUED] metroNIDAZOLE (FLAGYL) 500 MG tablet Take 1 tablet (500 mg total) by mouth 2 (two) times daily.   [DISCONTINUED] sulfamethoxazole-trimethoprim (BACTRIM DS) 800-160 MG tablet Take 1 tablet by mouth 2 (two) times daily.   No facility-administered medications prior to visit.    ROS Negative unless otherwise noted in HPI   Objective:     BP  112/78   Pulse 66   Temp 97.9 F (36.6 C) (Temporal)   Ht 5\' 2"  (1.575 m)   Wt 144 lb (65.3 kg)   SpO2 100%   BMI 26.34 kg/m   Physical Exam Constitutional:      General: She is not in acute distress.    Appearance: Normal appearance.  HENT:     Head: Normocephalic and atraumatic.  Pulmonary:     Effort: Pulmonary effort is normal. No respiratory distress.  Musculoskeletal:     Cervical back: Normal range of motion.  Neurological:     General: No focal deficit present.     Mental Status: She is alert and oriented to person, place, and time. Mental status is at baseline.  Psychiatric:        Mood and Affect: Mood normal.        Thought Content: Thought content normal.        Judgment: Judgment normal.      Assessment & Plan:  Insomnia, unspecified type Assessment & Plan: Well-controlled.  Continue quetiapine 25 mg daily at bedtime. Due to prescription for topiramate, making efforts to avoid medications that act as CNS depressants. Generally, with low doses of topiramate and quetiapine there is low risk for adverse reactions.  Will monitor closely.  Orders: -     QUEtiapine Fumarate; Take 1 tablet (25 mg total) by mouth at bedtime.  Dispense: 90 tablet; Refill: 1  Generalized anxiety disorder -     busPIRone HCl; Take 1 tablet (10 mg total) by mouth 2 (two) times daily as needed.  Dispense: 180 tablet; Refill: 1    Return in about 4 months (around 03/07/2024) for follow-up for mood and sleep.    Melida Quitter, PA

## 2023-11-08 NOTE — Progress Notes (Signed)
   Acute Office Visit  Subjective:    Patient ID: KAYDYNCE PAT, female    DOB: 08-Jun-1991, 33 y.o.   MRN: [id number]   HPI 33 y.o. presents today for left groin abscess. Reports this has been ongoing for about 6 months. Has been treated with antibiotics twice since then, most recently in December. Area completely resolves when treated. Drained a few days ago, no symptoms today. Sees derm.   No LMP recorded. (Menstrual status: IUD).    Review of Systems  Constitutional: Negative.   Genitourinary:  Positive for genital sores.       Objective:    Physical Exam Constitutional:      Appearance: Normal appearance.  Genitourinary:      BP 112/76 (BP Location: Right Arm, Patient Position: Sitting, Cuff Size: Normal)   Pulse 67   Wt 141 lb (64 kg)   SpO2 99%   BMI 25.79 kg/m  Wt Readings from Last 3 Encounters:  11/08/23 141 lb (64 kg)  11/08/23 144 lb (65.3 kg)  10/10/23 140 lb (63.5 kg)        Patient informed chaperone available to be present for breast and/or pelvic exam. Patient has requested no chaperone to be present. Patient has been advised what will be completed during breast and pelvic exam.   Assessment & Plan:   Problem List Items Addressed This Visit   None Visit Diagnoses       Recurrent infection of skin    -  Primary      Plan: Fully resolved. Discussed hardness under skin likely scar tissue versus cyst. Plans to discuss with derm at her next appointment next week. Recommend warm compresses and ibuprofen if it returns.   Return if symptoms worsen or fail to improve.    Olivia Mackie DNP, 11:11 AM 11/08/2023

## 2023-11-15 ENCOUNTER — Encounter: Payer: Self-pay | Admitting: Dermatology

## 2023-11-15 ENCOUNTER — Ambulatory Visit: Payer: BC Managed Care – PPO | Admitting: Dermatology

## 2023-11-15 ENCOUNTER — Ambulatory Visit: Payer: BC Managed Care – PPO | Admitting: Radiology

## 2023-11-15 VITALS — BP 108/70

## 2023-11-15 DIAGNOSIS — L74510 Primary focal hyperhidrosis, axilla: Secondary | ICD-10-CM

## 2023-11-15 DIAGNOSIS — R61 Generalized hyperhidrosis: Secondary | ICD-10-CM | POA: Diagnosis not present

## 2023-11-15 DIAGNOSIS — L750 Bromhidrosis: Secondary | ICD-10-CM

## 2023-11-15 DIAGNOSIS — L720 Epidermal cyst: Secondary | ICD-10-CM

## 2023-11-15 DIAGNOSIS — L723 Sebaceous cyst: Secondary | ICD-10-CM

## 2023-11-15 MED ORDER — DOXYCYCLINE HYCLATE 100 MG PO TABS
100.0000 mg | ORAL_TABLET | Freq: Two times a day (BID) | ORAL | 3 refills | Status: AC
Start: 2023-11-15 — End: ?

## 2023-11-15 MED ORDER — TRIAMCINOLONE ACETONIDE 40 MG/ML IJ SUSP: 40.0000 mg | Freq: Once | INTRAMUSCULAR | Status: AC

## 2023-11-15 NOTE — Patient Instructions (Addendum)
 Hello Ashley Perez,  Thank you for visiting us  today.  Here is a summary of the essential instructions from today's consultation:  Qbrexza : Continue its application every other night. Begin with the underarms, followed by hands, and feet last.  CeraVe with Benzoyl Peroxide: Utilize daily for bromhidrosis to diminish bacteria presence and control sweating.  Kenalog  Injection: Administered today for the cyst in the left inguinal crease. Expectation is for it to reduce in size within a few days.  Doxycycline : Prescribed for anticipated flare-ups of the cyst. Consume twice daily for five days upon symptom onset.  Follow-Up: A check-in is scheduled for July to evaluate progress and adjust prescriptions as necessary.  Please adhere to these instructions attentively and inform us  of any developments or concerns regarding your treatment plan.  Warm regards,  Dr. Delon Lenis,  Dermatology        Important Information  Due to recent changes in healthcare laws, you may see results of your pathology and/or laboratory studies on MyChart before the doctors have had a chance to review them. We understand that in some cases there may be results that are confusing or concerning to you. Please understand that not all results are received at the same time and often the doctors may need to interpret multiple results in order to provide you with the best plan of care or course of treatment. Therefore, we ask that you please give us  2 business days to thoroughly review all your results before contacting the office for clarification. Should we see a critical lab result, you will be contacted sooner.   If You Need Anything After Your Visit  If you have any questions or concerns for your doctor, please call our main line at [phone] If no one answers, please leave a voicemail as directed and we will return your call as soon as possible. Messages left after 4 pm will be answered the following business day.    You may also send us  a message via MyChart. We typically respond to MyChart messages within 1-2 business days.  For prescription refills, please ask your pharmacy to contact our office. Our fax number is [phone].  If you have an urgent issue when the clinic is closed that cannot wait until the next business day, you can page your doctor at the number below.    Please note that while we do our best to be available for urgent issues outside of office hours, we are not available 24/7.   If you have an urgent issue and are unable to reach us , you may choose to seek medical care at your doctor's office, retail clinic, urgent care center, or emergency room.  If you have a medical emergency, please immediately call 911 or go to the emergency department. In the event of inclement weather, please call our main line at [phone] for an update on the status of any delays or closures.  Dermatology Medication Tips: Please keep the boxes that topical medications come in in order to help keep track of the instructions about where and how to use these. Pharmacies typically print the medication instructions only on the boxes and not directly on the medication tubes.   If your medication is too expensive, please contact our office at [phone] or send us  a message through MyChart.   We are unable to tell what your co-pay for medications will be in advance as this is different depending on your insurance coverage. However, we may be able to find a substitute medication at  lower cost or fill out paperwork to get insurance to cover a needed medication.   If a prior authorization is required to get your medication covered by your insurance company, please allow us  1-2 business days to complete this process.  Drug prices often vary depending on where the prescription is filled and some pharmacies may offer cheaper prices.  The website www.goodrx.com contains coupons for medications through different  pharmacies. The prices here do not account for what the cost may be with help from insurance (it may be cheaper with your insurance), but the website can give you the price if you did not use any insurance.  - You can print the associated coupon and take it with your prescription to the pharmacy.  - You may also stop by our office during regular business hours and pick up a GoodRx coupon card.  - If you need your prescription sent electronically to a different pharmacy, notify our office through [facility] or by phone at [phone]

## 2023-11-15 NOTE — Progress Notes (Signed)
 Follow-Up Visit   Subjective  Ashley Perez is a 33 y.o. female who presents for the following: Hyperhidrosis/Bromhidrosis follow up - She did get Qbrexa (she uses every other night) and it works for her underarms but she sweats in other places also. She has not had any side effects with Qbrexa. She is using Cerave 4% BP wash for the bromhidrosis and it works great. She has high anxiety and that makes her sweating worse. She doesn't like to shake hands with people of remove her shoes at other people's houses because of her sweating.  She had GYN appointment last week and they noticed a cyst vs scar tissue and recommended that she have it checked by a dermatologist.     The following portions of the chart were reviewed this encounter and updated as appropriate: medications, allergies, medical history  Review of Systems:  No other skin or systemic complaints except as noted in HPI or Assessment and Plan.  Objective  Well appearing patient in no apparent distress; mood and affect are within normal limits.  A focused examination was performed of the following areas:   Relevant exam findings are noted in the Assessment and Plan.  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 However, palmar and plantar hyperhidrosis persist.   Plan:  Continue Qbrexza  for axillary hyperhidrosis on an every other night basis.  Start applying to palms and soles, applying in the following order: underarms, hands, then feet. Monitor for potential side effects, especially dry mouth and urinary retention. .  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  Inflamed Cyst, Left Inguinal Crease Assessment: Patient describes a recurrent cyst in the left  inguinal crease for approximately 6 months, experiencing cyclical swelling, drainage of white material and blood, followed by regression. Previous treatments with two different antibiotics were ineffective. Examination confirmed the cyst's presence at the underwear line, where it is prone to irritation.  Plan: - Inject 0.05 mL of Kenalog  40 into the cyst.   -Prescribe doxycycline  for future use, to be taken twice daily for 5 days at the onset of irritation.   -Recommend daily use of CeraVe benzoyl peroxide wash in the affected area. Advise the patient to avoid touching or squeezing the cyst post-injection.    INFLAMED EPIDERMOID CYST OF SKIN Left Inguinal Area Doxycycline  100 mg bid x 5  Intralesional injection - Left Inguinal Area Location: left inguinal crease  Informed Consent: Discussed risks (infection, pain, bleeding, bruising, thinning of the skin, loss of skin pigment, lack of resolution, and recurrence of lesion) and benefits of the procedure, as well as the alternatives. Informed consent was obtained. Preparation: The area was prepared a standard fashion.  Anesthesia:none  Procedure Details: An intralesional injection was performed with Kenalog  40 mg/cc. 0.05 cc in total were injected. NDC 9996-9701-71 Lot # [id number] Exp 02/2025  Total number of injections: 1  Plan: The patient was instructed on post-op care. Recommend OTC analgesia as needed for pain.   triamcinolone  acetonide (KENALOG -40) injection 40 mg - Left Inguinal Area   Return in about 5 months (around 04/13/2024) for Follow up.  I, Roseline Hutchinson, CMA, am acting as scribe for [employer], DO .   Documentation: I have reviewed the above documentation for accuracy and completeness, and  I agree with the above.  Delon Lenis, DO

## 2023-11-16 ENCOUNTER — Encounter: Payer: Self-pay | Admitting: Family Medicine

## 2023-12-01 ENCOUNTER — Encounter: Payer: Self-pay | Admitting: Radiology

## 2023-12-01 ENCOUNTER — Ambulatory Visit (INDEPENDENT_AMBULATORY_CARE_PROVIDER_SITE_OTHER): Payer: BC Managed Care – PPO | Admitting: Radiology

## 2023-12-01 VITALS — BP 106/70

## 2023-12-01 DIAGNOSIS — Z30431 Encounter for routine checking of intrauterine contraceptive device: Secondary | ICD-10-CM | POA: Diagnosis not present

## 2023-12-01 NOTE — Progress Notes (Signed)
     History:  33 y.o. [id number] here today for today for IUD string check; Mirena IUD was placed  10/10/23. No complaints about the IUD, no concerning side effects.  The following portions of the patient's history were reviewed and updated as appropriate: allergies, current medications, past family history, past medical history, past social history, past surgical history and problem list.   Review of Systems:  Pertinent items are noted in HPI.   Objective:  Physical Exam Blood pressure 106/70. Gen: NAD Abd: Soft, nontender and nondistended Pelvic: Normal appearing external genitalia; normal appearing vaginal mucosa and cervix.  IUD strings visualized, about 2 cm in length outside cervix.   Raynelle Fanning, CMA present for exam  Assessment & Plan:  Normal IUD check. Patient may keep IUD in place for up to 8 years. May remover sooner if she desires pregnancy, or has side effects within that time.   Arlie Solomons, [facility]

## 2023-12-07 DIAGNOSIS — F4323 Adjustment disorder with mixed anxiety and depressed mood: Secondary | ICD-10-CM | POA: Diagnosis not present

## 2023-12-18 DIAGNOSIS — F4323 Adjustment disorder with mixed anxiety and depressed mood: Secondary | ICD-10-CM | POA: Diagnosis not present

## 2024-01-01 DIAGNOSIS — F4323 Adjustment disorder with mixed anxiety and depressed mood: Secondary | ICD-10-CM | POA: Diagnosis not present

## 2024-01-08 DIAGNOSIS — M67432 Ganglion, left wrist: Secondary | ICD-10-CM | POA: Diagnosis not present

## 2024-01-08 DIAGNOSIS — Z4789 Encounter for other orthopedic aftercare: Secondary | ICD-10-CM | POA: Diagnosis not present

## 2024-01-25 DIAGNOSIS — F4323 Adjustment disorder with mixed anxiety and depressed mood: Secondary | ICD-10-CM | POA: Diagnosis not present

## 2024-02-08 DIAGNOSIS — F4323 Adjustment disorder with mixed anxiety and depressed mood: Secondary | ICD-10-CM | POA: Diagnosis not present

## 2024-03-07 ENCOUNTER — Ambulatory Visit: Payer: BC Managed Care – PPO | Admitting: Family Medicine

## 2024-03-14 DIAGNOSIS — F4323 Adjustment disorder with mixed anxiety and depressed mood: Secondary | ICD-10-CM | POA: Diagnosis not present

## 2024-03-18 DIAGNOSIS — H9311 Tinnitus, right ear: Secondary | ICD-10-CM | POA: Diagnosis not present

## 2024-03-18 DIAGNOSIS — H47322 Drusen of optic disc, left eye: Secondary | ICD-10-CM | POA: Diagnosis not present

## 2024-03-18 DIAGNOSIS — G932 Benign intracranial hypertension: Secondary | ICD-10-CM | POA: Diagnosis not present

## 2024-04-04 DIAGNOSIS — F4323 Adjustment disorder with mixed anxiety and depressed mood: Secondary | ICD-10-CM | POA: Diagnosis not present

## 2024-04-09 ENCOUNTER — Ambulatory Visit

## 2024-04-18 ENCOUNTER — Ambulatory Visit: Payer: BC Managed Care – PPO | Admitting: Dermatology

## 2024-04-22 DIAGNOSIS — F4323 Adjustment disorder with mixed anxiety and depressed mood: Secondary | ICD-10-CM | POA: Diagnosis not present

## 2024-05-06 DIAGNOSIS — H9311 Tinnitus, right ear: Secondary | ICD-10-CM | POA: Diagnosis not present

## 2024-05-09 DIAGNOSIS — F4323 Adjustment disorder with mixed anxiety and depressed mood: Secondary | ICD-10-CM | POA: Diagnosis not present

## 2024-05-22 ENCOUNTER — Ambulatory Visit: Admitting: Dermatology

## 2024-05-30 ENCOUNTER — Ambulatory Visit

## 2024-06-12 ENCOUNTER — Ambulatory Visit

## 2024-07-12 ENCOUNTER — Ambulatory Visit

## 2024-08-07 ENCOUNTER — Ambulatory Visit

## 2024-09-04 ENCOUNTER — Ambulatory Visit

## 2024-10-01 ENCOUNTER — Ambulatory Visit: Payer: BC Managed Care – PPO | Admitting: Radiology

## 2024-10-22 ENCOUNTER — Ambulatory Visit

## 2024-10-23 ENCOUNTER — Ambulatory Visit: Admitting: Dermatology
